# Patient Record
Sex: Female | Born: 1980 | Race: White | Hispanic: No | Marital: Single
Health system: Southern US, Community
[De-identification: ages and names within clinical notes are randomized; demographics above are authoritative.]

## PROBLEM LIST (undated history)

## (undated) ENCOUNTER — Inpatient Hospital Stay (HOSPITAL_COMMUNITY): Payer: Self-pay

## (undated) DIAGNOSIS — K469 Unspecified abdominal hernia without obstruction or gangrene: Secondary | ICD-10-CM

## (undated) DIAGNOSIS — K219 Gastro-esophageal reflux disease without esophagitis: Secondary | ICD-10-CM

## (undated) DIAGNOSIS — K429 Umbilical hernia without obstruction or gangrene: Secondary | ICD-10-CM

## (undated) DIAGNOSIS — K2970 Gastritis, unspecified, without bleeding: Secondary | ICD-10-CM

## (undated) HISTORY — DX: Unspecified abdominal hernia without obstruction or gangrene: K46.9

## (undated) HISTORY — PX: CHOLECYSTECTOMY: SHX55

---
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
## 2002-12-27 ENCOUNTER — Inpatient Hospital Stay (HOSPITAL_COMMUNITY): Admission: AD | Admit: 2002-12-27 | Discharge: 2002-12-27 | Payer: Self-pay | Admitting: *Deleted

## 2002-12-30 ENCOUNTER — Inpatient Hospital Stay (HOSPITAL_COMMUNITY): Admission: AD | Admit: 2002-12-30 | Discharge: 2002-12-30 | Payer: Self-pay | Admitting: *Deleted

## 2003-01-05 ENCOUNTER — Inpatient Hospital Stay (HOSPITAL_COMMUNITY): Admission: AD | Admit: 2003-01-05 | Discharge: 2003-01-07 | Payer: Self-pay | Admitting: Obstetrics & Gynecology

## 2003-01-06 ENCOUNTER — Encounter: Payer: Self-pay | Admitting: Obstetrics & Gynecology

## 2003-01-10 ENCOUNTER — Inpatient Hospital Stay (HOSPITAL_COMMUNITY): Admission: AD | Admit: 2003-01-10 | Discharge: 2003-01-10 | Payer: Self-pay | Admitting: *Deleted

## 2003-01-11 ENCOUNTER — Inpatient Hospital Stay (HOSPITAL_COMMUNITY): Admission: AD | Admit: 2003-01-11 | Discharge: 2003-01-11 | Payer: Self-pay | Admitting: Obstetrics and Gynecology

## 2003-01-22 ENCOUNTER — Inpatient Hospital Stay (HOSPITAL_COMMUNITY): Admission: AD | Admit: 2003-01-22 | Discharge: 2003-01-23 | Payer: Self-pay | Admitting: Obstetrics and Gynecology

## 2003-01-26 ENCOUNTER — Observation Stay (HOSPITAL_COMMUNITY): Admission: AD | Admit: 2003-01-26 | Discharge: 2003-01-27 | Payer: Self-pay | Admitting: Obstetrics & Gynecology

## 2003-01-26 ENCOUNTER — Encounter: Admission: RE | Admit: 2003-01-26 | Discharge: 2003-01-26 | Payer: Self-pay | Admitting: Family Medicine

## 2003-01-29 ENCOUNTER — Inpatient Hospital Stay (HOSPITAL_COMMUNITY): Admission: AD | Admit: 2003-01-29 | Discharge: 2003-01-29 | Payer: Self-pay | Admitting: Family Medicine

## 2003-01-31 ENCOUNTER — Other Ambulatory Visit: Admission: RE | Admit: 2003-01-31 | Discharge: 2003-01-31 | Payer: Self-pay | Admitting: Obstetrics and Gynecology

## 2003-01-31 ENCOUNTER — Inpatient Hospital Stay (HOSPITAL_COMMUNITY): Admission: AD | Admit: 2003-01-31 | Discharge: 2003-01-31 | Payer: Self-pay | Admitting: Obstetrics and Gynecology

## 2003-02-01 ENCOUNTER — Inpatient Hospital Stay (HOSPITAL_COMMUNITY): Admission: AD | Admit: 2003-02-01 | Discharge: 2003-02-01 | Payer: Self-pay | Admitting: Obstetrics and Gynecology

## 2003-02-02 ENCOUNTER — Other Ambulatory Visit: Admission: RE | Admit: 2003-02-02 | Discharge: 2003-02-02 | Payer: Self-pay | Admitting: Obstetrics and Gynecology

## 2003-02-03 ENCOUNTER — Inpatient Hospital Stay (HOSPITAL_COMMUNITY): Admission: AD | Admit: 2003-02-03 | Discharge: 2003-02-04 | Payer: Self-pay | Admitting: Obstetrics and Gynecology

## 2003-02-03 ENCOUNTER — Encounter: Payer: Self-pay | Admitting: Obstetrics and Gynecology

## 2003-06-04 ENCOUNTER — Inpatient Hospital Stay (HOSPITAL_COMMUNITY): Admission: AD | Admit: 2003-06-04 | Discharge: 2003-06-06 | Payer: Self-pay | Admitting: Obstetrics and Gynecology

## 2003-06-07 ENCOUNTER — Inpatient Hospital Stay (HOSPITAL_COMMUNITY): Admission: AD | Admit: 2003-06-07 | Discharge: 2003-06-07 | Payer: Self-pay | Admitting: Obstetrics and Gynecology

## 2003-06-08 ENCOUNTER — Inpatient Hospital Stay (HOSPITAL_COMMUNITY): Admission: AD | Admit: 2003-06-08 | Discharge: 2003-06-10 | Payer: Self-pay | Admitting: Obstetrics and Gynecology

## 2003-07-13 ENCOUNTER — Inpatient Hospital Stay (HOSPITAL_COMMUNITY): Admission: AD | Admit: 2003-07-13 | Discharge: 2003-07-13 | Payer: Self-pay | Admitting: Obstetrics and Gynecology

## 2003-07-18 ENCOUNTER — Inpatient Hospital Stay (HOSPITAL_COMMUNITY): Admission: AD | Admit: 2003-07-18 | Discharge: 2003-07-20 | Payer: Self-pay | Admitting: Obstetrics and Gynecology

## 2003-08-15 ENCOUNTER — Other Ambulatory Visit: Admission: RE | Admit: 2003-08-15 | Discharge: 2003-08-15 | Payer: Self-pay | Admitting: Obstetrics and Gynecology

## 2004-08-27 ENCOUNTER — Other Ambulatory Visit: Admission: RE | Admit: 2004-08-27 | Discharge: 2004-08-27 | Payer: Self-pay | Admitting: Obstetrics and Gynecology

## 2004-12-04 ENCOUNTER — Ambulatory Visit (HOSPITAL_COMMUNITY): Admission: RE | Admit: 2004-12-04 | Discharge: 2004-12-04 | Payer: Self-pay | Admitting: Obstetrics and Gynecology

## 2006-04-30 ENCOUNTER — Encounter (INDEPENDENT_AMBULATORY_CARE_PROVIDER_SITE_OTHER): Payer: Self-pay | Admitting: *Deleted

## 2006-04-30 ENCOUNTER — Ambulatory Visit (HOSPITAL_COMMUNITY): Admission: RE | Admit: 2006-04-30 | Discharge: 2006-04-30 | Payer: Self-pay | Admitting: Surgery

## 2006-08-17 ENCOUNTER — Emergency Department (HOSPITAL_COMMUNITY): Admission: EM | Admit: 2006-08-17 | Discharge: 2006-08-17 | Payer: Self-pay | Admitting: Family Medicine

## 2006-10-14 ENCOUNTER — Other Ambulatory Visit: Admission: RE | Admit: 2006-10-14 | Discharge: 2006-10-14 | Payer: Self-pay | Admitting: Obstetrics and Gynecology

## 2006-12-22 ENCOUNTER — Emergency Department (HOSPITAL_COMMUNITY): Admission: EM | Admit: 2006-12-22 | Discharge: 2006-12-22 | Payer: Self-pay | Admitting: Emergency Medicine

## 2006-12-22 ENCOUNTER — Ambulatory Visit: Payer: Self-pay | Admitting: Vascular Surgery

## 2007-01-12 ENCOUNTER — Ambulatory Visit (HOSPITAL_COMMUNITY): Admission: RE | Admit: 2007-01-12 | Discharge: 2007-01-12 | Payer: Self-pay | Admitting: Obstetrics and Gynecology

## 2007-05-21 ENCOUNTER — Inpatient Hospital Stay (HOSPITAL_COMMUNITY): Admission: RE | Admit: 2007-05-21 | Discharge: 2007-05-23 | Payer: Self-pay | Admitting: Obstetrics and Gynecology

## 2007-05-26 ENCOUNTER — Emergency Department (HOSPITAL_COMMUNITY): Admission: EM | Admit: 2007-05-26 | Discharge: 2007-05-26 | Payer: Self-pay | Admitting: Emergency Medicine

## 2007-06-25 ENCOUNTER — Other Ambulatory Visit: Admission: RE | Admit: 2007-06-25 | Discharge: 2007-06-25 | Payer: Self-pay | Admitting: Obstetrics and Gynecology

## 2007-12-01 ENCOUNTER — Emergency Department (HOSPITAL_COMMUNITY): Admission: EM | Admit: 2007-12-01 | Discharge: 2007-12-01 | Payer: Self-pay | Admitting: Family Medicine

## 2008-01-03 ENCOUNTER — Other Ambulatory Visit: Payer: Self-pay | Admitting: Emergency Medicine

## 2008-01-03 ENCOUNTER — Emergency Department (HOSPITAL_COMMUNITY): Admission: EM | Admit: 2008-01-03 | Discharge: 2008-01-03 | Payer: Self-pay | Admitting: Emergency Medicine

## 2008-04-19 ENCOUNTER — Emergency Department (HOSPITAL_COMMUNITY): Admission: EM | Admit: 2008-04-19 | Discharge: 2008-04-20 | Payer: Self-pay | Admitting: Emergency Medicine

## 2008-11-08 ENCOUNTER — Emergency Department (HOSPITAL_COMMUNITY): Admission: EM | Admit: 2008-11-08 | Discharge: 2008-11-08 | Payer: Self-pay | Admitting: Family Medicine

## 2009-03-29 ENCOUNTER — Inpatient Hospital Stay (HOSPITAL_COMMUNITY): Admission: AD | Admit: 2009-03-29 | Discharge: 2009-03-30 | Payer: Self-pay | Admitting: Obstetrics and Gynecology

## 2009-04-20 ENCOUNTER — Inpatient Hospital Stay (HOSPITAL_COMMUNITY): Admission: AD | Admit: 2009-04-20 | Discharge: 2009-04-20 | Payer: Self-pay | Admitting: Obstetrics & Gynecology

## 2009-05-16 ENCOUNTER — Ambulatory Visit (HOSPITAL_COMMUNITY): Admission: RE | Admit: 2009-05-16 | Discharge: 2009-05-16 | Payer: Self-pay | Admitting: Diagnostic Radiology

## 2009-06-21 ENCOUNTER — Inpatient Hospital Stay (HOSPITAL_COMMUNITY): Admission: AD | Admit: 2009-06-21 | Discharge: 2009-06-22 | Payer: Self-pay | Admitting: Obstetrics

## 2009-08-02 ENCOUNTER — Ambulatory Visit (HOSPITAL_COMMUNITY): Admission: RE | Admit: 2009-08-02 | Discharge: 2009-08-02 | Payer: Self-pay | Admitting: Obstetrics

## 2009-10-13 ENCOUNTER — Inpatient Hospital Stay (HOSPITAL_COMMUNITY): Admission: AD | Admit: 2009-10-13 | Discharge: 2009-10-15 | Payer: Self-pay | Admitting: Obstetrics

## 2009-10-13 ENCOUNTER — Inpatient Hospital Stay (HOSPITAL_COMMUNITY): Admission: AD | Admit: 2009-10-13 | Discharge: 2009-10-13 | Payer: Self-pay | Admitting: Obstetrics

## 2010-07-14 ENCOUNTER — Encounter: Payer: Self-pay | Admitting: Internal Medicine

## 2010-08-14 ENCOUNTER — Ambulatory Visit: Admit: 2010-08-14 | Payer: Self-pay | Admitting: Internal Medicine

## 2010-08-14 ENCOUNTER — Ambulatory Visit: Payer: Self-pay | Admitting: Internal Medicine

## 2010-08-21 ENCOUNTER — Encounter: Payer: Self-pay | Admitting: Internal Medicine

## 2010-08-21 ENCOUNTER — Ambulatory Visit (INDEPENDENT_AMBULATORY_CARE_PROVIDER_SITE_OTHER): Payer: BC Managed Care – PPO | Admitting: Internal Medicine

## 2010-08-21 DIAGNOSIS — K219 Gastro-esophageal reflux disease without esophagitis: Secondary | ICD-10-CM | POA: Insufficient documentation

## 2010-08-21 DIAGNOSIS — Z23 Encounter for immunization: Secondary | ICD-10-CM

## 2010-08-21 DIAGNOSIS — B079 Viral wart, unspecified: Secondary | ICD-10-CM

## 2010-08-21 DIAGNOSIS — R9431 Abnormal electrocardiogram [ECG] [EKG]: Secondary | ICD-10-CM | POA: Insufficient documentation

## 2010-08-29 NOTE — Assessment & Plan Note (Signed)
Summary: NEW [employer] # CD   Vital Signs:  Patient profile:   30 year old female Menstrual status:  regular LMP:     08/17/2010 Height:      63 inches Weight:      154 pounds BMI:     27.38 O2 Sat:      97 % on Room air Temp:     98.3 degrees F oral Pulse rate:   58 / minute Pulse rhythm:   regular Resp:     16 per minute BP sitting:   106 / 70  (left arm) Cuff size:   large  Vitals Entered By: Rock Nephew CMA (August 21, 2010 9:23 AM)  Nutrition Counseling: Patient's BMI is greater than 25 and therefore counseled on weight management options.  O2 Flow:  Room air CC: New to establish Is Patient Diabetic? No Pain Assessment Patient in pain? no       Does patient need assistance? Functional Status Self care Ambulation Normal LMP (date): 08/17/2010     Menstrual Status regular Enter LMP: 08/17/2010 Last PAP Result normal   Primary Care Provider:  Etta Grandchild MD  CC:  New to establish.  History of Present Illness: New to me she asks to be referred to a Cardiologist. She has had several episodes of chest pain over the last 3 years and was told in an ER that she had a heart murmur. The murmur is not always heard but she knows that she can bring it out if she takes in a lot of caffeine and chocolate.  Also, she has warts around her left knee and she wants to treat them.  Preventive Screening-Counseling & Management  Alcohol-Tobacco     Smoking Status: never  Caffeine-Diet-Exercise     Does Patient Exercise: no      Drug Use:  no.    Current Medications (verified): 1)  Omeprazole 20 Mg Tbec (Omeprazole) .... Take 1 Tablet By Mouth Once A Day  Allergies (verified): No Known Drug Allergies  Past History:  Past Medical History: GERD  Past Surgical History: Cholecystectomy  Family History: Family History Depression  Social History: Occupation: Radio broadcast assistant Married Never Smoked Alcohol use-no Drug use-no Regular exercise-no Smoking Status:   never Drug Use:  no Does Patient Exercise:  no  Review of Systems       The patient complains of weight gain and chest pain.  The patient denies anorexia, fever, weight loss, syncope, dyspnea on exertion, peripheral edema, prolonged cough, headaches, hemoptysis, abdominal pain, melena, hematochezia, severe indigestion/heartburn, hematuria, transient blindness, difficulty walking, depression, enlarged lymph nodes, and angioedema.   CV:  Complains of chest pain or discomfort and weight gain; denies difficulty breathing at night, difficulty breathing while lying down, fainting, fatigue, leg cramps with exertion, lightheadness, near fainting, palpitations, shortness of breath with exertion, and swelling of feet. Derm:  Complains of lesion(s); denies changes in color of skin, changes in nail beds, dryness, excessive perspiration, flushing, hair loss, insect bite(s), itching, poor wound healing, and rash.  Physical Exam  General:  alert, well-developed, well-nourished, well-hydrated, appropriate dress, normal appearance, healthy-appearing, cooperative to examination, and good hygiene.   Head:  normocephalic, atraumatic, no abnormalities observed, and no abnormalities palpated.   Eyes:  vision grossly intact and pupils equal.   Ears:  R ear normal and L ear normal.   Nose:  External nasal examination shows no deformity or inflammation. Nasal mucosa are pink and moist without lesions or exudates. Mouth:  Oral  mucosa and oropharynx without lesions or exudates.  Teeth in good repair. Neck:  supple, full ROM, no masses, no thyromegaly, no thyroid nodules or tenderness, no JVD, normal carotid upstroke, no carotid bruits, no cervical lymphadenopathy, and no neck tenderness.   Chest Wall:  No deformities, masses, or tenderness noted. Lungs:  normal respiratory effort, no intercostal retractions, no accessory muscle use, normal breath sounds, no dullness, no fremitus, no crackles, and no wheezes.   Heart:   normal rate, regular rhythm, no murmur, no gallop, no rub, and no JVD.   Abdomen:  soft, non-tender, normal bowel sounds, no distention, no masses, no guarding, no rigidity, no rebound tenderness, no abdominal hernia, no inguinal hernia, no hepatomegaly, and no splenomegaly.   Msk:  No deformity or scoliosis noted of thoracic or lumbar spine.   Pulses:  R and L carotid,radial,femoral,dorsalis pedis and posterior tibial pulses are full and equal bilaterally Extremities:  No clubbing, cyanosis, edema, or deformity noted with normal full range of motion of all joints.   Neurologic:  No cranial nerve deficits noted. Station and gait are normal. Plantar reflexes are down-going bilaterally. DTRs are symmetrical throughout. Sensory, motor and coordinative functions appear intact. Skin:  turgor normal, color normal, no rashes, no suspicious lesions, no ecchymoses, no petechiae, no purpura, no ulcerations, no edema, and tattoo(s).  Around her left knee there are scatterred verrucous lesions that are flat and slightly tanned. There is one prominent lesion that is liner/horizontal. Cervical Nodes:  no anterior cervical adenopathy and no posterior cervical adenopathy.   Axillary Nodes:  no R axillary adenopathy and no L axillary adenopathy.   Psych:  Cognition and judgment appear intact. Alert and cooperative with normal attention span and concentration. No apparent delusions, illusions, hallucinations Additional Exam:  Her EKG shows diffusely flat and inverted T waves and LAE. There are no Q waves. The rate is bradycardic.   Impression & Recommendations:  Problem # 1:  WART, VIRAL (ICD-078.10) Assessment New start Aldara Cream as directed  Problem # 2:  ABNORMAL ELECTROCARDIOGRAM (ICD-794.31) Assessment: New  Orders: EKG w/ Interpretation (93000) Cardiology Referral (Cardiology)  Complete Medication List: 1)  Omeprazole 20 Mg Tbec (Omeprazole) .... Take 1 tablet by mouth once a day 2)  Imiquimod 5 %  Crea (Imiquimod) .... Apply a small amount to warts every monday wednesday and friday for 2 weeks  Other Orders: Tdap => 10yrs IM (04540) Admin 1st Vaccine (98119)  PAP Screening:    Last PAP smear:  07/14/2010  Osteoporosis Risk Assessment:  Risk Factors for Fracture or Low Bone Density:   Race (White or Asian):     yes   Smoking status:       never  Immunization & Chemoprophylaxis:    Tetanus vaccine: Tdap  (08/21/2010)  Patient Instructions: 1)  Please schedule a follow-up appointment in 2 months. 2)  It is important that you exercise regularly at least 20 minutes 5 times a week. If you develop chest pain, have severe difficulty breathing, or feel very tired , stop exercising immediately and seek medical attention. 3)  You need to lose weight. Consider a lower calorie diet and regular exercise.  Prescriptions: IMIQUIMOD 5 % CREA (IMIQUIMOD) Apply a small amount to warts every Monday Wednesday and Friday for 2 weeks  #12 packs x 1   Entered and Authorized by:   Etta Grandchild MD   Signed by:   Etta Grandchild MD on 08/21/2010   Method used:   Print then  Mail to Patient   RxID:   [id number]    Orders Added: 1)  EKG w/ Interpretation [93000] 2)  Cardiology Referral [Cardiology] 3)  Tdap => 58yrs IM [90715] 4)  Admin 1st Vaccine [90471] 5)  New Patient Level IV [14782]   Immunizations Administered:  Tetanus Vaccine:    Vaccine Type: Tdap    Site: left deltoid    Mfr: GlaxoSmithKline    Dose: 0.5 ml    Route: IM    Given by: Rock Nephew CMA    Exp. Date: 05/02/2012    Lot #: [id number]    VIS given: 05/31/08 version given August 21, 2010.   Immunizations Administered:  Tetanus Vaccine:    Vaccine Type: Tdap    Site: left deltoid    Mfr: GlaxoSmithKline    Dose: 0.5 ml    Route: IM    Given by: Rock Nephew CMA    Exp. Date: 05/02/2012    Lot #: [id number]    VIS given: 05/31/08 version given August 21, 2010.  Preventive Care Screening  Pap  Smear:    Date:  07/14/2010    Results:  normal

## 2010-09-10 DIAGNOSIS — J329 Chronic sinusitis, unspecified: Secondary | ICD-10-CM | POA: Insufficient documentation

## 2010-09-10 DIAGNOSIS — R1115 Cyclical vomiting syndrome unrelated to migraine: Secondary | ICD-10-CM | POA: Insufficient documentation

## 2010-09-10 DIAGNOSIS — Z8679 Personal history of other diseases of the circulatory system: Secondary | ICD-10-CM | POA: Insufficient documentation

## 2010-09-10 DIAGNOSIS — F319 Bipolar disorder, unspecified: Secondary | ICD-10-CM | POA: Insufficient documentation

## 2010-09-11 ENCOUNTER — Ambulatory Visit (INDEPENDENT_AMBULATORY_CARE_PROVIDER_SITE_OTHER): Payer: BC Managed Care – PPO | Admitting: Cardiovascular Disease

## 2010-09-11 ENCOUNTER — Encounter: Payer: Self-pay | Admitting: Cardiovascular Disease

## 2010-09-11 DIAGNOSIS — R079 Chest pain, unspecified: Secondary | ICD-10-CM | POA: Insufficient documentation

## 2010-09-11 DIAGNOSIS — R011 Cardiac murmur, unspecified: Secondary | ICD-10-CM

## 2010-09-11 DIAGNOSIS — R072 Precordial pain: Secondary | ICD-10-CM

## 2010-09-19 NOTE — Assessment & Plan Note (Signed)
Summary: np6, abn ekg.per mary from [facility]. notes in emr. bcbs. gd   Primary Provider:  Etta Grandchild MD  CC:  referal form Dr. Yetta Barre pt states she was having chest tightness.Marland Kitchenand sob.  History of Present Illness: 41 you history of murmur and SSCP. Mulltiple ER visits for atypical pain.  R/O.  Did not get ETT in past as she had no insurance.  Pain is sharp.  Occurs when she is stressed.  3 children and both she and her husband try to work.  Overweight but no HTN, lipids,DM or smoking.  Also been told she has a murmur in past.  Primary indicated abnormal ECG. 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  Given ER visits and long standing history reasonable to do stress echo.  Symptoms appear to be related to anxiety and stress  Current Problems (verified): 1)  Heart Murmur, Hx of  (ICD-V12.50) 2)  Bipolar Disorder Unspecified  (ICD-296.80) 3)  Sinusitis  (ICD-473.9) 4)  Hyperemesis  (ICD-536.2) 5)  Family History Depression  (ICD-V17.0) 6)  Gerd  (ICD-530.81) 7)  Wart, Viral  (ICD-078.10) 8)  Abnormal Electrocardiogram  (ICD-794.31)  Current Medications (verified): 1)  Omeprazole 20 Mg Tbec (Omeprazole) .... Take 1 Tablet By Mouth Once A Day 2)  Imiquimod 5 % Crea (Imiquimod) .... Apply A Small Amount To Warts Every Monday Wednesday and Friday For 2 Weeks  Allergies (verified): No Known Drug Allergies  Past History:  Past Medical History: Last updated: 09/10/2010  HEART MURMUR, HX OF BIPOLAR DISORDER UNSPECIFIED SINUSITIS HYPEREMESIS  FAMILY HISTORY DEPRESSION  GERD WART, VIRAL  ABNORMAL ELECTROCARDIOGRAM  Past Surgical History: Last updated: 08/21/2010 Cholecystectomy  Family History: Last updated: 09/10/2010 Family History Depression cancer, diabetes   Social History: Last updated: 08/21/2010 Occupation: nail tech Married Never Smoked Alcohol use-no Drug use-no Regular exercise-no  Review of Systems       Denies fever, malais, weight loss, blurry vision,  decreased visual acuity, cough, sputum, SOB, hemoptysis, pleuritic pain, palpitaitons, heartburn, abdominal pain, melena, lower extremity edema, claudication, or rash.   Vital Signs:  Patient profile:   30 year old female Menstrual status:  regular Height:      63 inches Weight:      155 pounds BMI:     27.56 Pulse rate:   60 / minute Resp:     16 per minute BP sitting:   115 / 80  (left arm)  Vitals Entered By: Kem Parkinson (September 11, 2010 12:00 PM)  Physical Exam  General:  Affect appropriate Healthy:  appears stated age HEENT: normal Neck supple with no adenopathy JVP normal no bruits no thyromegaly Lungs clear with no wheezing and good diaphragmatic motion Heart:  S1/S2 splict no murmur,rub, gallop or click PMI normal Abdomen: benighn, BS positve, no tenderness, no AAA no bruit.  No HSM or HJR Distal pulses intact with no bruits No edema Neuro non-focal Skin warm and dry    Impression & Recommendations:  Problem # 1:  HEART MURMUR, HX OF (ICD-V12.50) No murmur on exam.  Split secodn heart sound.  Echo   Problem # 2:  ABNORMAL ELECTROCARDIOGRAM (ICD-794.31) Normal variant ICRBBB Orders: Stress Echo (Stress Echo)  Problem # 3:  CHEST PAIN UNSPECIFIED (ICD-786.50) Atypical  Stress echo ordered  Patient Instructions: 1)  Your physician recommends that you schedule a follow-up appointment as needed with Dr. Eden Emms. 2)  Your physician recommends that you continue on your current medications as directed. Please refer to the Current Medication  list given to you today. 3)  Your physician has requested that you have a stress echocardiogram. For further information please visit [email or web address]  Please follow instruction sheet as given.

## 2010-09-24 ENCOUNTER — Encounter: Payer: BC Managed Care – PPO | Admitting: Physician Assistant

## 2010-09-24 ENCOUNTER — Other Ambulatory Visit (HOSPITAL_COMMUNITY): Payer: BC Managed Care – PPO

## 2010-09-25 ENCOUNTER — Encounter (INDEPENDENT_AMBULATORY_CARE_PROVIDER_SITE_OTHER): Payer: Self-pay | Admitting: *Deleted

## 2010-10-01 ENCOUNTER — Other Ambulatory Visit (HOSPITAL_COMMUNITY): Payer: Self-pay | Admitting: Cardiovascular Disease

## 2010-10-01 DIAGNOSIS — R9431 Abnormal electrocardiogram [ECG] [EKG]: Secondary | ICD-10-CM

## 2010-10-01 NOTE — Letter (Signed)
Summary: Appointment - Reschedule  [facility], Main Office  [address]   [place], [place] [address]   Phone: [phone]  Fax: [phone]     September 25, 2010 MRN: [id number]   [name] [address] [place], [place]  [address]   Dear [name],   Due to a change in our office schedule, your appointment on  10-02-10  at 1:00pm               must be changed, it was rescheduled from 3-13 and the treadmill was not put in correctly and there is not enough time to do it on the 21st.  It is very important that we reach you to reschedule this appointment. We look forward to participating in your health care needs. Please contact us at the number listed above at your earliest convenience to reschedule this appointment.     Sincerely,  Glass blower/designer

## 2010-10-02 ENCOUNTER — Other Ambulatory Visit (HOSPITAL_COMMUNITY): Payer: BC Managed Care – PPO | Admitting: Radiology

## 2010-10-02 ENCOUNTER — Other Ambulatory Visit (HOSPITAL_COMMUNITY): Payer: BC Managed Care – PPO

## 2010-10-02 LAB — CBC
HCT: 33.3 % — ABNORMAL LOW (ref 36.0–46.0)
Hemoglobin: 11.6 g/dL — ABNORMAL LOW (ref 12.0–15.0)
Platelets: 214 10*3/uL (ref 150–400)
RBC: 3.8 MIL/uL — ABNORMAL LOW (ref 3.87–5.11)
RBC: 4.45 MIL/uL (ref 3.87–5.11)
WBC: 13.4 10*3/uL — ABNORMAL HIGH (ref 4.0–10.5)
WBC: 14.8 10*3/uL — ABNORMAL HIGH (ref 4.0–10.5)

## 2010-10-02 LAB — RPR: RPR Ser Ql: NONREACTIVE

## 2010-10-15 LAB — CBC
MCHC: 34.5 g/dL (ref 30.0–36.0)
RBC: 4.71 MIL/uL (ref 3.87–5.11)

## 2010-10-15 LAB — COMPREHENSIVE METABOLIC PANEL
ALT: 14 U/L (ref 0–35)
AST: 18 U/L (ref 0–37)
Albumin: 3.7 g/dL (ref 3.5–5.2)
CO2: 25 mEq/L (ref 19–32)
Calcium: 9.1 mg/dL (ref 8.4–10.5)
GFR calc Af Amer: >60 mL/min (ref >60)
GFR calc non Af Amer: >60 mL/min (ref >60)
Sodium: 136 mEq/L (ref 135–145)
Total Protein: 7.4 g/dL (ref 6.0–8.3)

## 2010-10-15 LAB — URINALYSIS, ROUTINE W REFLEX MICROSCOPIC
Bilirubin Urine: NEGATIVE
Hgb urine dipstick: NEGATIVE
Nitrite: NEGATIVE
Specific Gravity, Urine: >1.03 — ABNORMAL HIGH (ref 1.005–1.030)
pH: 6 (ref 5.0–8.0)

## 2010-10-15 LAB — URINE MICROSCOPIC-ADD ON

## 2010-10-17 LAB — URINALYSIS, ROUTINE W REFLEX MICROSCOPIC
Glucose, UA: NEGATIVE mg/dL
Ketones, ur: >80 mg/dL — AB
pH: 6 (ref 5.0–8.0)

## 2010-10-17 LAB — URINE MICROSCOPIC-ADD ON

## 2010-10-17 LAB — BASIC METABOLIC PANEL
BUN: 6 mg/dL (ref 6–23)
Calcium: 9 mg/dL (ref 8.4–10.5)
GFR calc non Af Amer: >60 mL/min (ref >60)
Glucose, Bld: 72 mg/dL (ref 70–99)

## 2010-10-17 LAB — URINE CULTURE: Colony Count: 3000

## 2010-10-18 LAB — URINALYSIS, ROUTINE W REFLEX MICROSCOPIC
Bilirubin Urine: NEGATIVE
Hgb urine dipstick: NEGATIVE
Ketones, ur: 15 mg/dL — AB
Nitrite: NEGATIVE
Urobilinogen, UA: 0.2 mg/dL (ref 0.0–1.0)

## 2010-10-18 LAB — COMPREHENSIVE METABOLIC PANEL
Alkaline Phosphatase: 39 U/L (ref 39–117)
BUN: 7 mg/dL (ref 6–23)
CO2: 26 mEq/L (ref 19–32)
GFR calc non Af Amer: >60 mL/min (ref >60)
Glucose, Bld: 91 mg/dL (ref 70–99)
Potassium: 3 mEq/L — ABNORMAL LOW (ref 3.5–5.1)
Total Protein: 5.7 g/dL — ABNORMAL LOW (ref 6.0–8.3)

## 2010-11-26 NOTE — Discharge Summary (Signed)
[name], [name]               ACCOUNT NO.:  [id number]   MEDICAL RECORD NO.:  [id number]          PATIENT TYPE:  INP   LOCATION:  9123                          [facility]   PHYSICIAN:  James A. Ashley Royalty, M.D.DATE OF BIRTH:  04-Aug-1980   DATE OF ADMISSION:  05/21/2007  DATE OF DISCHARGE:  05/23/2007                               DISCHARGE SUMMARY   DISCHARGE DIAGNOSES:  1. Intrauterine pregnancy at [redacted] weeks gestation, delivered.  2. Bipolar disorder.  3. Term birth living child, vertex.   OPERATIONS AND PROCEDURES:  OB delivery with repair of second-degree  midline laceration.   CONSULTATIONS:  None.   DISCHARGE MEDICATIONS:  Percocet, Motrin.   HISTORY AND PHYSICAL:  This is a 24-year gravida 3, para 1-0-1-1 at [redacted]  weeks gestation.  Prenatal care was complicated by high-grade emesis as  well as positive chlamydia culture which was treated.  The patient was  admitted for induction secondary to musculoskeletal discomfort.  Initial  cervical evaluation by me revealed a dilatation of 4 cm, 80% effacement,  -2 station, vertex presentation.  For remainder of History and Physical,  please see chart.   HOSPITAL COURSE:  The patient was admitted to [facility].  Admission laboratory studies were drawn.  She went on to  deliver on May 21, 2007.  The infant was 8 pounds 4 ounces female  Apgars 9 at 1 minute and 9 at 5 minutes, sent to the newborn nursery.  Delivery was accomplished over an intact perineum.  There was a second-  degree midline laceration which was repaired without difficulty.  The  patient's postpartum course was benign.  She was discharged on the  second postpartum day, afebrile and in satisfactory condition.   DISPOSITION:  The patient is to return to [facility] approximately June 25, 2007, for postpartum evaluation.      James A. Ashley Royalty, M.D.  Electronically Signed     JAM/MEDQ  D:  06/24/2007  T:   06/24/2007  Job:  [id number]

## 2010-11-29 NOTE — H&P (Signed)
NAME:  [name], [name]                         ACCOUNT NO.:  [id number]   MEDICAL RECORD NO.:  [id number]                   PATIENT TYPE:  OBV   LOCATION:  9399                                 [facility]   PHYSICIAN:  James A. Ashley Royalty, M.D.             DATE OF BIRTH:  September 08, 1980   DATE OF ADMISSION:  06/08/2003  DATE OF DISCHARGE:                                HISTORY & PHYSICAL   HISTORY OF PRESENT ILLNESS:  This is a 30 year old gravida 2 para 0 AB 1;  EDC is July 27, 2003; 32 weeks six days gestation.  Prenatal care has  been complicated by significant hyperemesis gravidarum which early on in the  pregnancy required hospitalization and ultimately outpatient Reglan pump.  The patient subsequently became asymptomatic and the pump was discontinued.  She continued to gain weight nicely through the second trimester.  Recently  she has experienced recurrent exacerbations of nausea and vomiting which  have required frequent trips to maternity admission and a recent short  hospitalization as well.  The patient returns today complaining of nausea,  vomiting, and inability to maintain p.o. intake on an outpatient basis.  She  has had some intermittent contractions but no genuine diagnosis of preterm  labor has been made.  She denies rupture of membranes or bleeding.   MEDICATIONS:  Currently on:  1. Reglan 10 mg t.i.d. p.r.n. nausea.  2. Prenatal vitamins.   PAST MEDICAL HISTORY:  Bipolar disorder.   SURGICAL HISTORY:  Negative.   FAMILY HISTORY:  Noncontributory.   SOCIAL HISTORY:  The patient denies the use of tobacco or significant  alcohol.   REVIEW OF SYSTEMS:  Noncontributory.   PHYSICAL EXAMINATION:  GENERAL:  A well-developed, well-nourished,  diminutive white female in no acute distress.  VITAL SIGNS:  Afebrile; vital signs stable.  SKIN:  Warm and dry without lesions.  LYMPH:  There is no supraclavicular, cervical, or inguinal adenopathy.  HEENT:   Normocephalic.  NECK:  Supple without thyromegaly.  CHEST:  Lungs are clear.  CARDIAC:  Regular rate and rhythm without murmurs, gallops, or rubs.  BREAST:  Deferred.  ABDOMEN:  Gravid with a fundal height of approximately 33.  Fetal heart  tones are auscultated with the Doppler.  MUSCULOSKELETAL:  Reveals full range of motion with edema, cyanosis, or CVA  tenderness.  PELVIC:  External genitalia within normal limits.  Digital examination  reveals the cervix to be closed, soft, apparent vertex, and presenting part  high.   LABORATORY DATA:  Lab today reveals a urinalysis with a specific gravity of  greater than 1.030, greater than 80 ketones, few bacteria, and few  epithelial cells.   IMPRESSION:  1. Intrauterine pregnancy at approximately [redacted] weeks gestation.  2. Bipolar disorder.  3. Hyperemesis gravidarum.   PLAN:  As the patient had numerous laboratory studies drawn in the last  several days will not repeat additional studies.  Will place  on 23-hour  observation, give IV fluids and antiemetic medication.  As this is  Thanksgiving Day, will consider pursuing outpatient home health visits when  home health agencies are open after the holiday weekend.                                               James A. Ashley Royalty, M.D.    JAM/MEDQ  D:  06/08/2003  T:  06/08/2003  Job:  [id number]

## 2010-11-29 NOTE — Discharge Summary (Signed)
NAME:  PRABHNOOR, ELLENBERGER                         ACCOUNT NO.:  [id number]   MEDICAL RECORD NO.:  [id number]                   PATIENT TYPE:  INP   LOCATION:  9307                                 [facility]   PHYSICIAN:  Rudy Jew. Ashley Royalty, M.D.             DATE OF BIRTH:  October 15, 1980   DATE OF ADMISSION:  02/03/2003  DATE OF DISCHARGE:  02/04/2003                                 DISCHARGE SUMMARY   ADMITTING DIAGNOSES:  1. Intrauterine pregnancy at 15+ weeks.  2. Hyperemesis.  3. Possible bipolar disorder.   DISCHARGE DIAGNOSES:  1. Intrauterine pregnancy at 15+ weeks.  2. Hyperemesis.  3. Possible bipolar disorders.   PROCEDURES:  1. IV hydration.  2. Initiation of a Zofran pump per home health agency.   HOSPITAL COURSE:  Mikaya Bunner was admitted on February 03, 2003 to the  maternity admissions unit where she was admitted for a 23-hour observation.  At that time it was noted that her specific gravity upon urine examination  was greater than 1.030 as well as greater than 80 ketones.  Her hemoglobin  upon admission was 13.5 with a hematocrit of 38.6.  She is a 30 year old G2  P0 AB1 with an EDC of July 26, 2003.  For past medical history please  refer the history and physical noted in the chart.  The patient received a  course of IV hydration of lactated Ringer's.  Phenergan 25 mg was added to  the bag.  Prior to discharge a Reglan pump per [facility] was  initiated but later was switched to a Zofran pump prior to discharge.  A  mental health evaluation was performed per Dr. Jeanie Sewer with [facility].  The patient was at this time determined to be bipolar  per Dr. Jeanie Sewer this examination.  She was also noted to be under  duress/stress after several trips to the emergency department were noted  within her chart.  Her history of bipolar coupled with her hyperemesis has  led to an acute onset of anxiety.  Dr. Jeanie Sewer recommended Ambien 10 mg at  h.s. to aid in her sleeping upon discharge.  The patient was discharged home  after the 23-hour observation and initiation of her Zofran pump for [facility].   DISCHARGE INSTRUCTIONS:  The patient is to remain on bedrest for the  remainder of the week until her nausea subsides.  She is also to report an  exacerbations of her bipolar disorder to [facility] at the  follow-up with them.   DIET:  The patient is encouraged to follow a bland diet, to increase her  fluids, and to advance her diet as she can tolerate it.   DISCHARGE MEDICATIONS:  1. Zofran pump per protocol for [facility].  2. Ambien 10 mg p.o. at h.s.  3. Prenatal vitamins.   DISCHARGE FOLLOW-UP:  The patient to  follow up within one to two weeks at  [facility] with Dr. Lonell Face.     Velora Mediate, NP                           Rudy Jew. Ashley Royalty, M.D.    TC/MEDQ  D:  03/08/2003  T:  03/08/2003  Job:  [id number]

## 2010-11-29 NOTE — Discharge Summary (Signed)
NAME:  [name], [name]                         ACCOUNT NO.:  [id number]   MEDICAL RECORD NO.:  [id number]                   PATIENT TYPE:  INP   LOCATION:  9115                                 [facility]   PHYSICIAN:  James A. Ashley Royalty, M.D.             DATE OF BIRTH:  [birth year]   DATE OF ADMISSION:  07/18/2003  DATE OF DISCHARGE:  07/20/2003                                 DISCHARGE SUMMARY   DISCHARGE DIAGNOSES:  1. Intrauterine pregnancy at term, delivered.  2. Term birth, low ________ vertex.  3. History of hyperemesis.  4. Late presentation for obstetrical care.   OPERATION/SPECIAL PROCEDURES:  OB delivery with episiotomy, episiorrhaphy.   CONSULTATIONS:  None.   DISCHARGE MEDICATIONS:  Tylenol.   HISTORY AND PHYSICAL:  This is a 30 year old, gravida 2, para 0, AB1, 39-1/[redacted]  weeks gestation. Prenatal care complicated by hyperemesis, treated with  numerous agents and ultimately with Zofran pump. The patient had a traumatic  weight loss in early pregnancy and was just about her pre-pregnancy weight  by the time of admission.  Examination revealed the cervix to be 3 cm  dilated, 90% effaced, -2 station, vertex presentation.   HOSPITAL COURSE:  The patient was admitted to [facility].  Admission laboratory studies were drawn. She was admitted for  induction secondary to exacerbation of hyperemesis and advanced cervical  changes. The induction was successful and she went on to deliver on July 18, 2003. The infant was a 6 pound 14 ounce female, Apgars 9 at 1 minute and 9  at 5 minutes, and sent to the newborn nursery. Delivery was accomplished by  Dr. Sylvester Harder over a second-degree midline episiotomy which was  repaired without difficulty. The patient's postpartum course was benign. She  was discharged home on the second postpartum day, afebrile, and in  satisfactory condition.   CLINICAL FINDINGS:  Hemoglobin and hematocrit on admission were 12.5  and  35.7 respectively. Repeat values obtained on July 19, 2003, were 12.3 and  34.5 respectively.   DISPOSITION:  The patient is return to [facility] in  four to six weeks for postpartum visit.                                               James A. Ashley Royalty, M.D.    JAM/MEDQ  D:  08/23/2003  T:  08/23/2003  Job:  [id number]

## 2010-11-29 NOTE — Discharge Summary (Signed)
NAME:  [name], [name]                         ACCOUNT NO.:  [id number]   MEDICAL RECORD NO.:  [id number]                   PATIENT TYPE:  INP   LOCATION:  9325                                 FACILITY:  [facility]   PHYSICIAN:  James A. Ashley Royalty, M.D.             DATE OF BIRTH:  30-Jan-1981   DATE OF ADMISSION:  06/04/2003  DATE OF DISCHARGE:  06/06/2003                                 DISCHARGE SUMMARY   DISCHARGE DIAGNOSES:  1. Intrauterine pregnancy at 32-weeks gestation.  2. Hyperemesis.   OPERATION/SPECIAL PROCEDURES:  None.   DISCHARGE MEDICATIONS:  Zofran 8 mg q.8-12h. p.r.n.   HISTORY AND PHYSICAL:  This is a 30 year old, gravida 2, para 0, AB1, with  an EDC of July 27, 2003, at approximately 32-1/2 weeks on admission. The  patient presents complaining of recurrent nausea and vomiting. She has had a  history of same throughout the entire pregnancy off and on. She has been  numerous medications and has been maintained recently with Zofran. For the  remainder of the history and physical, please see chart.   HOSPITAL COURSE:  The patient was admitted to [facility]  [place]. Admission laboratory studies were drawn. She was given  antiemetic medications and IV fluids with excellent results. On June 06, 2003, she was felt stable for discharge and discharged home in satisfactory  condition.   DISPOSITION:  The patient is returned to the [facility] in  approximately one week for her next antenatal visit. She is to continue the  Zofran as prescribed.                                               James A. Ashley Royalty, M.D.    JAM/MEDQ  D:  08/10/2003  T:  08/11/2003  Job:  [id number]

## 2010-11-29 NOTE — H&P (Signed)
NAME:  [name], OTTER                         ACCOUNT NO.:  [id number]   MEDICAL RECORD NO.:  [id number]                   PATIENT TYPE:  INP   LOCATION:  9307                                 [facility]ILITY:  [facility]   PHYSICIAN:  Rudy Jew. Ashley Royalty, M.D.             DATE OF BIRTH:  01/08/1981   DATE OF ADMISSION:  02/03/2003  DATE OF DISCHARGE:                                HISTORY & PHYSICAL   HISTORY OF PRESENT ILLNESS:  The patient is a 30 year old gravida 2, para 0,  ab 1, last menstrual period October 18, 2002, [facility] (by menstrual dates) July 26, 2003, currently 15 weeks and 1 day by menstrual dates. The patient  initiated obstetrical care in my office yesterday. At that time she related  a history of numerous visits to the emergency room during this pregnancy for  dehydration  and nausea and vomiting. She has been on Reglan, Phenergan and  Zofran without good success. Yesterday she was placed on Phenergan  suppositories. However, she returned to the admissions unit this morning  stating that she had began throwing up yesterday afternoon and had not  gotten reasonable relief from the Phenergan suppositories. The patient  presents for admission and workup of hyperemesis.   MEDICATIONS:  Phenergan 25 mg per rectum q.4h. p.r.n. nausea.   PAST MEDICAL HISTORY:  Negative. Specifically the patient at the time of  this dictation denies any psychiatric history.   PAST SURGICAL HISTORY:  Therapeutic abortion January 2003 at approximately  [redacted] weeks gestation. The patient states that she had substantial nausea and  vomiting during that pregnancy which impacted her decision to pursue the  termination.   ALLERGIES:  None.   FAMILY HISTORY:  Positive for diabetes and psychiatric disease.   SOCIAL HISTORY:  The patient denies use of tobacco or significant alcohol.   REVIEW OF SYSTEMS:  Noncontributory.   PHYSICAL EXAMINATION:  GENERAL:  The patient is a diminutive white female,  in no acute  distress.  VITAL SIGNS:  Stable.  SKIN:  Warm and dry without lesions.  LYMPH:  No supraclavicular, cervical or inguinal adenopathy.  HEENT:  Normocephalic.  NECK:  Supple without thyromegaly.  LUNGS:  Clear.  CARDIAC:  Regular rate and rhythm without murmur, rub or gallops.  BREASTS:  Examination  is deferred.  ABDOMEN:  Soft, nontender without masses or organomegaly. Fetal heart  tones  are also obtained with a Doppler.  MUSCULOSKELETAL:  Range of motion without edema, cyanosis or CVA tenderness.  PELVIC:  February 02, 2003, revealed normal external genitalia and vagina and  cervix. The cervical os was closed. The bimanual examination revealed the  uterus to be approximately  15 weeks size.    IMPRESSION:  1. Intrauterine pregnancy at approximately 15 weeks, 1 day gestation (by     menstrual dates).  2. Nausea and vomiting, probable hyperemesis gravidarum.  3. Late  presentation for care.  PLAN:  The patient is admitted for 23 hour observation. Will obtain a case  management consult as well as behavioral medicine consult. Will check labs  and arrange obstetrical ultrasound. We are currently making attempts to  initiate subcutaneous pump therapy for this patient either  with Reglan or  Zofran.                                                James A. Ashley Royalty, M.D.    JAM/MEDQ  D:  02/03/2003  T:  02/03/2003  Job:  [id number]

## 2010-11-29 NOTE — H&P (Signed)
NAME:  [name], [name]                         ACCOUNT NO.:  [id number]   MEDICAL RECORD NO.:  [id number]                   PATIENT TYPE:  INP   LOCATION:  9325                                 [facility]ILITY:  [facility]   PHYSICIAN:  Juan H. Lily Peer, M.D.             DATE OF BIRTH:  [birth year]   DATE OF ADMISSION:  06/04/2003  DATE OF DISCHARGE:                                HISTORY & PHYSICAL   CHIEF COMPLAINT:  Nausea and vomiting.   HISTORY:  The patient is a 30 year old gravida 2, para 0, AB 1, with a due  date of July 27, 2003, currently at 32-1/2 weeks' estimated gestational  age, who [facility] complaining of nausea  and vomiting which started a couple of days ago and worsened today.  On  arrival to the [facility] she was vomiting approximately every 30  minutes.  Review of her prenatal history had indicated that she has suffered  from nausea and vomiting throughout her pregnancy, whereby she has been on  different antiemetic agents such as Phenergan, Reglan, Zofran orally and  subsequently had been on a Zofran pump, which she states was discontinued  approximately three months ago.  On arrival to [facility] she was  placed on the monitor and was found to be having some contractions every  three to five minutes apart and she was started on IV hydration consisting  of LR with multivitamin infusion with 10 mg of Reglan in the bag, and she  was given a 500 mL bolus, followed by 125/mL/hr.  A wet prep had  demonstrated many clue cells and her urinalysis had a specific gravity of  1.020 and negative for nitrite and small amount of leukocyte esterase, and  her electrolytes were normal, as was her CBC.   PAST MEDICAL HISTORY:  Patient with a history of bipolar disorder, has not  been on any medication.  Multiple bouts of nausea and vomiting throughout  her pregnancy.  See history of present illness above.  Also, she has been  treated for  bacterial vaginosis at 15 weeks' gestation as well.   REVIEW OF SYSTEMS:  See [place] form.   PHYSICAL EXAMINATION:  VITAL SIGNS:  Blood pressure 127/84 and 135/77, pulse  70, respirations 18, temperature 98.0.  HEENT:  Unremarkable.  NECK:  Supple, trachea midline, no carotid bruits, no thyromegaly.  CHEST:  Lungs were clear to auscultation, no rhonchi or wheezes.  CARDIAC:  Regular rate and rhythm, no murmurs or gallops.  BREASTS:  Exam not done.  ABDOMEN:  Soft, nontender.  PELVIC:  Pelvic exam by nurse:  Cervix 1 cm, long, posterior.  EXTREMITIES:  No edema.   PRENATAL LABORATORY DATA:  Blood type AB positive, negative antibody screen.  VDRL was nonreactive.  Rubella immune.  Hepatitis B surface antigen was  negative.  HIV not tested.  GC and Chlamydia culture negative.  No other  studies in the chart.  Wet prep in the emergency room demonstrated many clue  cells.  Electrolytes were normal.  CBC was normal.  Urine:  Negative  nitrite, leukocyte esterase small, specific gravity 1.020.   ASSESSMENT:  A 30 year old gravida 2, para 0, AB 1, at 32-1/2 weeks'  estimated gestational age with longstanding history of hyperemesis  gravidarum with various forms of antiemetic regimens administered before.  The Zofran pump was the only one that was able to work well for her, which  was discontinued approximately three months ago and she had been doing well  until just a couple of days ago.  Aside from the laboratory information  obtained above, will go ahead and do a complete metabolic panel and also  check a TSH.  Since she has an IV going, to take care of her bacterial  vaginosis will hang up Flagyl 500 mg to be administered IV q.12h.  She did  receive terbutaline 0.25 mg subcu and along with an IV fluid bolus,  contractions have subsided.  She has a very reactive fetal heart rate  tracing.  A GBS culture was obtained, pending at time of this dictation.  She will be admitted due to the  fact that she was very agitated, wanted to  finish this pregnancy, and I was reassuring her that it is not the right  thing to do at this time since she is 32 weeks.  She was having shakes and  very anxious.  I do not know if perhaps this is part of her bipolar  disorder, but to calm her down and to relax her we gave her Stadol 2 mg IV  with 25 mg of Phenergan, which quieted her down and relaxed her.  We will go  ahead and place a Foley catheter so she will not have to be getting up out  of bed during the night, and in the morning we will make arrangements with  Dr. Ashley Royalty, her primary physician, to perhaps reinstituted once again the  Zofran pump.  All this was discussed with the patient and her boyfriend,  which was the only supporting individual present.   PLAN:  As per assessment above.                                               Juan H. Lily Peer, M.D.    JHF/MEDQ  D:  06/05/2003  T:  06/05/2003  Job:  [id number]   cc:   Fayrene Fearing A. Ashley Royalty, M.D.  [address]  [place], [place] [address]  Fax: [phone]

## 2010-11-29 NOTE — Op Note (Signed)
[name], [name]               ACCOUNT NO.:  [id number]   MEDICAL RECORD NO.:  [id number]          PATIENT TYPE:  AMB   LOCATION:  DAY                          FACILITY:  [facility]   PHYSICIAN:  Ardeth Sportsman, MD     DATE OF BIRTH:  26-Mar-1981   DATE OF PROCEDURE:  04/30/2006  DATE OF DISCHARGE:                                 OPERATIVE REPORT   SURGEON:  Ardeth Sportsman, M.D.   ASSISTANT:  Rose Phi. Maple Hudson, M.D.   REQUESTING PHYSICIAN:  Carola Frost, M.D.   PREOPERATIVE DIAGNOSIS:  Symptomatic cholecystolithiasis.   POSTOPERATIVE DIAGNOSIS:  Symptomatic cholecystolithiasis.   PROCEDURE PERFORMED:  Laparoscopic cholecystectomy.   ANESTHESIA:  1. General.  2. Local anesthetic in a field block around all port sites.   SPECIMENS:  Gallbladder.   DRAINS:  None.   ESTIMATED BLOOD LOSS:  Less than 5 mL.   COMPLICATIONS:  None apparent.   INDICATIONS FOR PROCEDURE:  Ms. Wolff is a 30 year old female who has had  intermittent postprandial nausea and vomiting that has persisted two years  after having delivery of her child.  She has known gallstones and had a  classic episodes of biliary colic.  The anatomy and physiology of  hepatobiliary and pancreatic function was discussed.  The technique of  laparoscopic cholecystectomy was explained and recommended.  The risks of  stroke, heart attack, pulmonary embolism, and death were discussed.  Bleeding, need for transfusion, wound infection, abscess, injury to other  organs, bile duct injury, and other risks were discussed in detail.  She  understood these risks and wished to proceed.   OPERATIVE FINDINGS:  She had a dilated but normal appearing gallbladder with  cholesterol based stones.   DESCRIPTION OF PROCEDURE:  Informed consent was obtained.  The patient  underwent general anesthesia without difficulty and she was positioned  supine with both arms tucked.  Her abdomen was prepped and draped in a  sterile fashion.  Local  anesthetic was placed as a field block using 0.25%  Bupivacaine without epinephrine as noted above.  The first entry was done  using a Veress technique through a periumbilical 5 mm incision.  A towel  clamp was used to elevate the fascia of counter traction and the Veress  passed in the abdomen without difficulty.  Capnoperitoneum to 15 mmHg to  provide good abdominal insufflation.  Under direct visualization, a 10 mm  port was placed in the subxiphoid region and 5 mm ports were placed in the  right upper quadrant region.   The gallbladder fundus was grasped and elevated cephalad.  Inspection  revealed normal appearing liver and small bowel with no obvious adhesions.  The peritoneum was freed off the anteromedial and posterior aspects of the  gallbladder and careful controlled dissection was done to identify two  structures leaving the gallbladder going down to the porta hepatis,  consistent with the cystic artery and cystic duct.  One clip on the  gallbladder side and then two clips slightly proximally were placed and both  these structures were transected staying close to the gallbladder.  Cautery  was used to free the gallbladder from its remaining attachments to the  liver.  The gallbladder was removed out the subxiphoid port intact and sent  to pathology.   Visual inspection noted clips were intact on the cystic artery and cystic  duct structures.  No evidence of any bleeding.  There was no bleeding in the  gallbladder bed.  Copious irrigation of saline was done.  The subxiphoid  port was closed using 0 Vicryl using a laparoscopic suture passer.  Capnoperitoneum was evacuated.  The skin was closed using 4-0 Monocryl  stitch.  A sterile dressing was applied.  The patient was extubated and  taken to the recovery room in stable condition.   I explained the operative findings to the patient's family.      Ardeth Sportsman, MD  Electronically Signed     SCG/MEDQ  D:  04/30/2006   T:  05/01/2006  Job:  [id number]   cc:   Carola Frost, M.D.

## 2011-04-10 LAB — POCT CARDIAC MARKERS
Myoglobin, poc: 34.8
Operator id: 244461
Operator id: 264031
Troponin i, poc: <0.05

## 2011-04-22 LAB — CBC
HCT: 35.2 — ABNORMAL LOW
Hemoglobin: 11.1 — ABNORMAL LOW
Hemoglobin: 12.5
MCV: 86.7
Platelets: 213
RBC: 3.68 — ABNORMAL LOW
RBC: 4.07
WBC: 10.9 — ABNORMAL HIGH
WBC: 8.4

## 2011-04-22 LAB — ABO/RH: ABO/RH(D): AB POS

## 2011-04-22 LAB — TYPE AND SCREEN: Antibody Screen: NEGATIVE

## 2011-05-01 LAB — COMPREHENSIVE METABOLIC PANEL
ALT: 12
AST: 14
CO2: 25
Calcium: 8.9
GFR calc Af Amer: >60
GFR calc non Af Amer: >60
Potassium: 3.7
Sodium: 137
Total Protein: 5.6 — ABNORMAL LOW

## 2011-05-01 LAB — URINE CULTURE: Culture: NO GROWTH

## 2011-05-01 LAB — DIFFERENTIAL
Eosinophils Absolute: 0.1
Eosinophils Relative: 1
Lymphs Abs: 1.7
Monocytes Relative: 5

## 2011-05-01 LAB — URINALYSIS, ROUTINE W REFLEX MICROSCOPIC
Glucose, UA: NEGATIVE
Hgb urine dipstick: NEGATIVE
Nitrite: NEGATIVE
Specific Gravity, Urine: 1.025
pH: 6

## 2011-05-01 LAB — CBC
MCHC: 34.7
RBC: 4.02

## 2011-05-01 LAB — URINE MICROSCOPIC-ADD ON

## 2013-06-13 ENCOUNTER — Encounter (HOSPITAL_COMMUNITY): Payer: Self-pay | Admitting: Emergency Medicine

## 2013-06-13 ENCOUNTER — Emergency Department (HOSPITAL_COMMUNITY)
Admission: EM | Admit: 2013-06-13 | Discharge: 2013-06-13 | Disposition: A | Discharge status: Home / Self Care | Payer: Self-pay | Attending: Emergency Medicine | Admitting: Emergency Medicine

## 2013-06-13 DIAGNOSIS — K2970 Gastritis, unspecified, without bleeding: Secondary | ICD-10-CM

## 2013-06-13 DIAGNOSIS — I4510 Unspecified right bundle-branch block: Secondary | ICD-10-CM | POA: Insufficient documentation

## 2013-06-13 DIAGNOSIS — K2900 Acute gastritis without bleeding: Secondary | ICD-10-CM | POA: Insufficient documentation

## 2013-06-13 DIAGNOSIS — Z79899 Other long term (current) drug therapy: Secondary | ICD-10-CM | POA: Insufficient documentation

## 2013-06-13 DIAGNOSIS — Z3202 Encounter for pregnancy test, result negative: Secondary | ICD-10-CM | POA: Insufficient documentation

## 2013-06-13 LAB — CBC WITH DIFFERENTIAL/PLATELET
Basophils Absolute: 0 10*3/uL (ref 0.0–0.1)
Basophils Relative: 0 % (ref 0–1)
Eosinophils Absolute: 0.1 10*3/uL (ref 0.0–0.7)
Eosinophils Relative: 1 % (ref 0–5)
Hemoglobin: 14.8 g/dL (ref 12.0–15.0)
Lymphocytes Relative: 27 % (ref 12–46)
MCH: 30.9 pg (ref 26.0–34.0)
MCHC: 35.1 g/dL (ref 30.0–36.0)
Monocytes Absolute: 0.5 10*3/uL (ref 0.1–1.0)
Platelets: 241 10*3/uL (ref 150–400)
RBC: 4.79 MIL/uL (ref 3.87–5.11)
RDW: 12.5 % (ref 11.5–15.5)

## 2013-06-13 LAB — URINALYSIS, ROUTINE W REFLEX MICROSCOPIC
Glucose, UA: NEGATIVE mg/dL
Protein, ur: 30 mg/dL — AB
Specific Gravity, Urine: 1.042 — ABNORMAL HIGH (ref 1.005–1.030)
Urobilinogen, UA: 1 mg/dL (ref 0.0–1.0)

## 2013-06-13 LAB — COMPREHENSIVE METABOLIC PANEL
ALT: 10 U/L (ref 0–35)
AST: 13 U/L (ref 0–37)
CO2: 26 mEq/L (ref 19–32)
Calcium: 9.1 mg/dL (ref 8.4–10.5)
Creatinine, Ser: 0.77 mg/dL (ref 0.50–1.10)
GFR calc non Af Amer: >90 mL/min (ref >90)
Sodium: 139 mEq/L (ref 135–145)
Total Protein: 7.2 g/dL (ref 6.0–8.3)

## 2013-06-13 LAB — POCT I-STAT TROPONIN I: Troponin i, poc: 0 ng/mL (ref 0.00–0.08)

## 2013-06-13 LAB — POCT PREGNANCY, URINE: Preg Test, Ur: NEGATIVE

## 2013-06-13 MED ORDER — GI COCKTAIL ~~LOC~~
30.0000 mL | Freq: Once | ORAL | Status: AC
  Administered 2013-06-13: 30 mL via ORAL
  Filled 2013-06-13: qty 30

## 2013-06-13 MED ORDER — OMEPRAZOLE 20 MG PO CPDR: 20.0000 mg | DELAYED_RELEASE_CAPSULE | Freq: Every day | ORAL | Status: DC

## 2013-06-13 NOTE — ED Provider Notes (Signed)
CSN: [id number]     Arrival date & time 06/13/13  1254 History   First MD Initiated Contact with Patient 06/13/13 1559     Chief Complaint  Patient presents with  . Abdominal Pain   (Consider location/radiation/quality/duration/timing/severity/associated sxs/prior Treatment) HPI Comments: Patient presents with epigastric pain that started this morning. She states it's a crampy pain that's been constant since this morning. It radiates slightly to her left upper abdomen. She denies any nausea vomiting or diarrhea. She denies a fevers or chills. She is currently on her menstrual cycle. She denies any abnormal discharge. She denies any back pain. She denies any urinary symptoms. She states her last bowel movement was 2 days ago which was normal for her. She denies any bloating or constipation. She's had a history of cholecystectomy about 8 years ago. She denies any other abdominal surgeries.  Patient is a 32 y.o. female presenting with abdominal pain.  Abdominal Pain Associated symptoms: no chest pain, no chills, no cough, no diarrhea, no fatigue, no fever, no hematuria, no nausea, no shortness of breath and no vomiting     History reviewed. No pertinent past medical history. Past Surgical History  Procedure Laterality Date  . Cholecystectomy     No family history on file. History  Substance Use Topics  . Smoking status: Never Smoker   . Smokeless tobacco: Not on file  . Alcohol Use: No   OB History   Grav Para Term Preterm Abortions TAB SAB Ect Mult Living                 Review of Systems  Constitutional: Negative for fever, chills, diaphoresis and fatigue.  HENT: Negative for congestion, rhinorrhea and sneezing.   Eyes: Negative.   Respiratory: Negative for cough, chest tightness and shortness of breath.   Cardiovascular: Negative for chest pain and leg swelling.  Gastrointestinal: Positive for abdominal pain. Negative for nausea, vomiting, diarrhea and blood in stool.   Genitourinary: Negative for frequency, hematuria, flank pain and difficulty urinating.  Musculoskeletal: Negative for arthralgias and back pain.  Skin: Negative for rash.  Neurological: Negative for dizziness, speech difficulty, weakness, numbness and headaches.    Allergies  Review of patient's allergies indicates no known allergies.  Home Medications   Current Outpatient Rx  Name  Route  Sig  Dispense  Refill  . omeprazole (PRILOSEC) 20 MG capsule   Oral   Take 1 capsule (20 mg total) by mouth daily.   30 capsule   0    BP 97/53  Pulse 57  Temp(Src) 98.1 F (36.7 C) (Oral)  Resp 16  Wt 155 lb (70.308 kg)  SpO2 97%  LMP 06/11/2013 Physical Exam  Constitutional: She is oriented to person, place, and time. She appears well-developed and well-nourished.  HENT:  Head: Normocephalic and atraumatic.  Eyes: Pupils are equal, round, and reactive to light.  Neck: Normal range of motion. Neck supple.  Cardiovascular: Normal rate, regular rhythm and normal heart sounds.   Pulmonary/Chest: Effort normal and breath sounds normal. No respiratory distress. She has no wheezes. She has no rales. She exhibits no tenderness.  Abdominal: Soft. Bowel sounds are normal. There is tenderness (mild TTP epigastrium, LUQ.). There is no rebound and no guarding.  Musculoskeletal: Normal range of motion. She exhibits no edema.  Lymphadenopathy:    She has no cervical adenopathy.  Neurological: She is alert and oriented to person, place, and time.  Skin: Skin is warm and dry. No rash noted.  Psychiatric:  She has a normal mood and affect.    ED Course  Procedures (including critical care time) Labs Review Results for orders placed during the hospital encounter of 06/13/13  CBC WITH DIFFERENTIAL      Result Value Range   WBC 9.2  4.0 - 10.5 K/uL   RBC 4.79  3.87 - 5.11 MIL/uL   Hemoglobin 14.8  12.0 - 15.0 g/dL   HCT 96.0  45.4 - 09.8 %   MCV 88.1  78.0 - 100.0 fL   MCH 30.9  26.0 - 34.0 pg    MCHC 35.1  30.0 - 36.0 g/dL   RDW 11.9  14.7 - 82.9 %   Platelets 241  150 - 400 K/uL   Neutrophils Relative % 66  43 - 77 %   Neutro Abs 6.1  1.7 - 7.7 K/uL   Lymphocytes Relative 27  12 - 46 %   Lymphs Abs 2.4  0.7 - 4.0 K/uL   Monocytes Relative 6  3 - 12 %   Monocytes Absolute 0.5  0.1 - 1.0 K/uL   Eosinophils Relative 1  0 - 5 %   Eosinophils Absolute 0.1  0.0 - 0.7 K/uL   Basophils Relative 0  0 - 1 %   Basophils Absolute 0.0  0.0 - 0.1 K/uL  COMPREHENSIVE METABOLIC PANEL      Result Value Range   Sodium 139  135 - 145 mEq/L   Potassium 3.9  3.5 - 5.1 mEq/L   Chloride 103  96 - 112 mEq/L   CO2 26  19 - 32 mEq/L   Glucose, Bld 88  70 - 99 mg/dL   BUN 17  6 - 23 mg/dL   Creatinine, Ser 5.62  0.50 - 1.10 mg/dL   Calcium 9.1  8.4 - 13.0 mg/dL   Total Protein 7.2  6.0 - 8.3 g/dL   Albumin 4.3  3.5 - 5.2 g/dL   AST 13  0 - 37 U/L   ALT 10  0 - 35 U/L   Alkaline Phosphatase 52  39 - 117 U/L   Total Bilirubin 0.3  0.3 - 1.2 mg/dL   GFR calc non Af Amer >90  >90 mL/min   GFR calc Af Amer >90  >90 mL/min  LIPASE, BLOOD      Result Value Range   Lipase 34  11 - 59 U/L  URINALYSIS, ROUTINE W REFLEX MICROSCOPIC      Result Value Range   Color, Urine RED (*) YELLOW   APPearance TURBID (*) CLEAR   Specific Gravity, Urine 1.042 (*) 1.005 - 1.030   pH 6.0  5.0 - 8.0   Glucose, UA NEGATIVE  NEGATIVE mg/dL   Hgb urine dipstick LARGE (*) NEGATIVE   Bilirubin Urine SMALL (*) NEGATIVE   Ketones, ur 15 (*) NEGATIVE mg/dL   Protein, ur 30 (*) NEGATIVE mg/dL   Urobilinogen, UA 1.0  0.0 - 1.0 mg/dL   Nitrite NEGATIVE  NEGATIVE   Leukocytes, UA SMALL (*) NEGATIVE  URINE MICROSCOPIC-ADD ON      Result Value Range   Squamous Epithelial / LPF RARE  RARE   WBC, UA 0-2  <3 WBC/hpf   RBC / HPF TOO NUMEROUS TO COUNT  <3 RBC/hpf   Bacteria, UA FEW (*) RARE  POCT PREGNANCY, URINE      Result Value Range   Preg Test, Ur NEGATIVE  NEGATIVE  POCT I-STAT TROPONIN I      Result Value Range  Troponin i, poc 0.00  0.00 - 0.08 ng/mL   Comment 3            No results found.   Imaging Review No results found.  EKG Interpretation    Date/Time:  Monday June 13 2013 13:25:28 EST Ventricular Rate:  60 PR Interval:  146 QRS Duration: 96 QT Interval:  394 QTC Calculation: 394 R Axis:   88 Text Interpretation:  Normal sinus rhythm Right atrial enlargement Incomplete right bundle branch block Borderline ECG since last tracing no significant change Confirmed by Lovelle Deitrick  MD, Kayne Yuhas ([id number]) on 06/13/2013 4:46:12 PM            MDM   1. Gastritis    Patient's discomfort was relieved with a GI cocktail. She has no evidence of pancreatitis. Her abdomen is only mildly tender on exam. She has no vomiting fevers or other concerning signs. She was discharged home with a prescription for Prilosec. I advised her in a bland diet. I encouraged her to get outpatient followup if her symptoms are not improving. Advised to return here for symptoms worsen.    Rolan Bucco, MD 06/13/13 [phone]

## 2013-06-13 NOTE — ED Notes (Signed)
Pt is here with pain above navel to left side since this am.  No vomiting or diarrhea.  LMP started 2 days ago.

## 2013-06-13 NOTE — ED Notes (Signed)
Dr. Belfi back at the bedside.  

## 2013-06-13 NOTE — ED Notes (Signed)
Dr. Belfi at the bedside.  

## 2013-06-30 ENCOUNTER — Encounter (HOSPITAL_COMMUNITY): Payer: Self-pay | Admitting: *Deleted

## 2013-06-30 ENCOUNTER — Inpatient Hospital Stay (HOSPITAL_COMMUNITY)
Admission: AD | Admit: 2013-06-30 | Discharge: 2013-06-30 | Disposition: A | Discharge status: Home / Self Care | Payer: Self-pay | Source: Ambulatory Visit | Attending: Emergency Medicine | Admitting: Emergency Medicine

## 2013-06-30 ENCOUNTER — Encounter (HOSPITAL_COMMUNITY): Admission: AD | Disposition: A | Discharge status: Home / Self Care | Payer: Self-pay | Source: Ambulatory Visit

## 2013-06-30 ENCOUNTER — Inpatient Hospital Stay (HOSPITAL_COMMUNITY): Payer: Self-pay

## 2013-06-30 DIAGNOSIS — Z79899 Other long term (current) drug therapy: Secondary | ICD-10-CM | POA: Insufficient documentation

## 2013-06-30 DIAGNOSIS — K429 Umbilical hernia without obstruction or gangrene: Secondary | ICD-10-CM

## 2013-06-30 DIAGNOSIS — K56609 Unspecified intestinal obstruction, unspecified as to partial versus complete obstruction: Secondary | ICD-10-CM

## 2013-06-30 DIAGNOSIS — Z3202 Encounter for pregnancy test, result negative: Secondary | ICD-10-CM | POA: Insufficient documentation

## 2013-06-30 DIAGNOSIS — Z9089 Acquired absence of other organs: Secondary | ICD-10-CM | POA: Insufficient documentation

## 2013-06-30 DIAGNOSIS — R231 Pallor: Secondary | ICD-10-CM | POA: Insufficient documentation

## 2013-06-30 DIAGNOSIS — R112 Nausea with vomiting, unspecified: Secondary | ICD-10-CM | POA: Insufficient documentation

## 2013-06-30 HISTORY — DX: Gastritis, unspecified, without bleeding: K29.70

## 2013-06-30 HISTORY — DX: Gastro-esophageal reflux disease without esophagitis: K21.9

## 2013-06-30 LAB — URINE MICROSCOPIC-ADD ON

## 2013-06-30 LAB — COMPREHENSIVE METABOLIC PANEL
ALT: 11 U/L (ref 0–35)
Alkaline Phosphatase: 47 U/L (ref 39–117)
BUN: 10 mg/dL (ref 6–23)
Calcium: 9.2 mg/dL (ref 8.4–10.5)
Chloride: 104 mEq/L (ref 96–112)
GFR calc Af Amer: >90 mL/min (ref >90)
Glucose, Bld: 95 mg/dL (ref 70–99)
Potassium: 4.1 mEq/L (ref 3.5–5.1)
Sodium: 140 mEq/L (ref 135–145)
Total Bilirubin: 0.4 mg/dL (ref 0.3–1.2)
Total Protein: 6.9 g/dL (ref 6.0–8.3)

## 2013-06-30 LAB — URINALYSIS, ROUTINE W REFLEX MICROSCOPIC
Glucose, UA: NEGATIVE mg/dL
pH: 6 (ref 5.0–8.0)

## 2013-06-30 LAB — CBC
HCT: 39.8 % (ref 36.0–46.0)
Hemoglobin: 14.2 g/dL (ref 12.0–15.0)
MCH: 30.2 pg (ref 26.0–34.0)
MCHC: 35.7 g/dL (ref 30.0–36.0)
RBC: 4.7 MIL/uL (ref 3.87–5.11)
WBC: 10.1 10*3/uL (ref 4.0–10.5)

## 2013-06-30 LAB — POCT PREGNANCY, URINE: Preg Test, Ur: NEGATIVE

## 2013-06-30 SURGERY — REPAIR, HERNIA, UMBILICAL, ADULT
Anesthesia: General

## 2013-06-30 MED ORDER — SODIUM CHLORIDE 0.9 % IV SOLN
INTRAVENOUS | Status: DC
  Administered 2013-06-30: 11:00:00 via INTRAVENOUS

## 2013-06-30 MED ORDER — DOCUSATE SODIUM 100 MG PO CAPS: 100.0000 mg | ORAL_CAPSULE | Freq: Two times a day (BID) | ORAL | Status: DC

## 2013-06-30 MED ORDER — PROMETHAZINE HCL 25 MG/ML IJ SOLN
12.5000 mg | Freq: Once | INTRAMUSCULAR | Status: AC
  Administered 2013-06-30: 12.5 mg via INTRAVENOUS
  Filled 2013-06-30: qty 1

## 2013-06-30 MED ORDER — ONDANSETRON HCL 4 MG/2ML IJ SOLN
4.0000 mg | Freq: Once | INTRAMUSCULAR | Status: DC
  Filled 2013-06-30: qty 2

## 2013-06-30 MED ORDER — IOHEXOL 300 MG/ML  SOLN
50.0000 mL | INTRAMUSCULAR | Status: AC
  Administered 2013-06-30: 50 mL via ORAL

## 2013-06-30 MED ORDER — IOHEXOL 300 MG/ML  SOLN
100.0000 mL | Freq: Once | INTRAMUSCULAR | Status: AC | PRN
  Administered 2013-06-30: 100 mL via INTRAVENOUS

## 2013-06-30 MED ORDER — PROPOFOL 10 MG/ML IV BOLUS
INTRAVENOUS | Status: AC
  Filled 2013-06-30: qty 20

## 2013-06-30 MED ORDER — HYDROCODONE-ACETAMINOPHEN 5-325 MG PO TABS: 1.0000 | ORAL_TABLET | ORAL | Status: DC | PRN

## 2013-06-30 MED ORDER — KETOROLAC TROMETHAMINE 60 MG/2ML IM SOLN: 60.0000 mg | Freq: Once | INTRAMUSCULAR | Status: DC

## 2013-06-30 MED ORDER — METOCLOPRAMIDE HCL 5 MG/ML IJ SOLN
INTRAMUSCULAR | Status: AC
  Filled 2013-06-30: qty 2

## 2013-06-30 MED ORDER — MIDAZOLAM HCL 2 MG/2ML IJ SOLN
INTRAMUSCULAR | Status: AC
  Filled 2013-06-30: qty 2

## 2013-06-30 MED ORDER — MORPHINE SULFATE 4 MG/ML IJ SOLN
2.0000 mg | Freq: Once | INTRAMUSCULAR | Status: AC
  Administered 2013-06-30: 2 mg via INTRAVENOUS
  Filled 2013-06-30: qty 1

## 2013-06-30 MED ORDER — ONDANSETRON HCL 4 MG/2ML IJ SOLN
4.0000 mg | Freq: Once | INTRAMUSCULAR | Status: AC
  Administered 2013-06-30: 4 mg via INTRAVENOUS

## 2013-06-30 NOTE — ED Notes (Signed)
Patient with abdominal pain since December 1st.  Started with nausea and vomiting around 10 AM this morning went to [facility] hospital then sent here after umbilical hernia found.

## 2013-06-30 NOTE — MAU Provider Note (Signed)
Attestation of Attending Supervision of Advanced Practitioner (PA/CNM/NP): Evaluation and management procedures were performed by the Advanced Practitioner under my supervision and collaboration.  I have reviewed the Advanced Practitioner's note and chart, and I agree with the management and plan.  General Surgery contacted, patient will be sent to the [facility] ED for further evaluation and management of the periumbilical hernia with low grade small bowel obstruction.  Receiving attending physician is Dr. Ovidio Kin.  [facility] ED RN in charge notified. Patient agrees with this plan, has been NPO since 2000 last night . She will be transferred via [facility].   Jaynie Collins, MD, FACOG Attending Obstetrician & Gynecologist Faculty Practice, [facility]

## 2013-06-30 NOTE — ED Provider Notes (Signed)
MSE was initiated and I personally evaluated the patient and placed orders (if any) at  4:27 PM on June 30, 2013.  The patient appears stable so that the remainder of the MSE may be completed by another provider.  Patient was seen and evaluated by the [facility] surgery team for an incarcerated umbilical hernia.  On their evaluation the patient's hernia has self reduced.  The patient feels much better at this time.  I recommended stool softeners because she sometimes struggles with constipation.  She will followup with the general surgery office and she was instructed to return to the ER for any new or worsening symptoms including recurrent umbilical hernia with inability to reduce at home.   Lyanne Co, MD 06/30/13 [phone]

## 2013-06-30 NOTE — MAU Note (Addendum)
Pain above belly button started this morning.  "there is a knot there".  Pain is constant.  LMP was beginning of the month.

## 2013-06-30 NOTE — MAU Provider Note (Signed)
History     CSN: [id number]  Arrival date and time: 06/30/13 1191   First Provider Initiated Contact with Patient 06/30/13 1005      Chief Complaint  Patient presents with  . Abdominal Pain   HPI Pt is 32 yo not pregnant and present with umbilical pain with nausea.  Pt has not had fever or constipation or diarrhea. Pt had sudden onset of umbilical pain- pt took 2 advil this morning.  Pt last ate a 8:30 pm without nausea.  Pt's LMP was light 06/13/2013.  Pt was seen on 06/13/2013 for pain in her side.  Past Medical History  Diagnosis Date  . GERD (gastroesophageal reflux disease)   . Gastritis     Past Surgical History  Procedure Laterality Date  . Cholecystectomy      History reviewed. No pertinent family history.  History  Substance Use Topics  . Smoking status: Never Smoker   . Smokeless tobacco: Not on file  . Alcohol Use: No    Allergies: No Known Allergies  Prescriptions prior to admission  Medication Sig Dispense Refill  . ibuprofen (ADVIL,MOTRIN) 200 MG tablet Take 400 mg by mouth every 6 (six) hours as needed for mild pain or moderate pain.      . Prenatal Vit-Min-FA-Fish Oil ([employer] PRENATAL GUMMY PO) Take 2 tablets by mouth daily.        Review of Systems  Gastrointestinal: Positive for nausea, vomiting and abdominal pain.  Genitourinary: Negative for dysuria and urgency.   Physical Exam   Blood pressure 120/71, pulse 56, temperature 98.2 F (36.8 C), resp. rate 18, last menstrual period 06/13/2013.  Physical Exam  Vitals reviewed. Constitutional: She is oriented to person, place, and time. She appears well-developed and well-nourished. She appears distressed.  HENT:  Head: Normocephalic.  Eyes: Pupils are equal, round, and reactive to light.  Neck: Normal range of motion.  Cardiovascular: Normal rate.   Respiratory: Effort normal.  GI: Soft. There is tenderness.  Tender denisty about umbilicus  Musculoskeletal: Normal range of motion.   Neurological: She is alert and oriented to person, place, and time.  Skin: Skin is warm and dry. There is pallor.  Psychiatric: She has a normal mood and affect.    MAU Course  Procedures Discussed with Dr. Macon Large- who discussed with radiologist- will do CT with contrast Pt is having intense pain and nasuea and vomiting while in MAU- will start IV and give morphine and phenergan IV MDM Results for orders placed during the hospital encounter of 06/30/13 (from the past 24 hour(s))  URINALYSIS, ROUTINE W REFLEX MICROSCOPIC     Status: Abnormal   Collection Time    06/30/13  9:25 AM      Result Value Range   Color, Urine YELLOW  YELLOW   APPearance CLEAR  CLEAR   Specific Gravity, Urine >1.030 (*) 1.005 - 1.030   pH 6.0  5.0 - 8.0   Glucose, UA NEGATIVE  NEGATIVE mg/dL   Hgb urine dipstick TRACE (*) NEGATIVE   Bilirubin Urine NEGATIVE  NEGATIVE   Ketones, ur 15 (*) NEGATIVE mg/dL   Protein, ur NEGATIVE  NEGATIVE mg/dL   Urobilinogen, UA 0.2  0.0 - 1.0 mg/dL   Nitrite NEGATIVE  NEGATIVE   Leukocytes, UA MODERATE (*) NEGATIVE  URINE MICROSCOPIC-ADD ON     Status: Abnormal   Collection Time    06/30/13  9:25 AM      Result Value Range   Squamous Epithelial / LPF MANY (*)  RARE   WBC, UA 7-10  <3 WBC/hpf   RBC / HPF 0-2  <3 RBC/hpf   Bacteria, UA FEW (*) RARE   Urine-Other AMORPHOUS URATES/PHOSPHATES    POCT PREGNANCY, URINE     Status: None   Collection Time    06/30/13  9:31 AM      Result Value Range   Preg Test, Ur NEGATIVE  NEGATIVE  CBC     Status: None   Collection Time    06/30/13 10:11 AM      Result Value Range   WBC 10.1  4.0 - 10.5 K/uL   RBC 4.70  3.87 - 5.11 MIL/uL   Hemoglobin 14.2  12.0 - 15.0 g/dL   HCT 16.1  09.6 - 04.5 %   MCV 84.7  78.0 - 100.0 fL   MCH 30.2  26.0 - 34.0 pg   MCHC 35.7  30.0 - 36.0 g/dL   RDW 40.9  81.1 - 91.4 %   Platelets 212  150 - 400 K/uL  COMPREHENSIVE METABOLIC PANEL     Status: None   Collection Time    06/30/13 10:11 AM       Result Value Range   Sodium 140  135 - 145 mEq/L   Potassium 4.1  3.5 - 5.1 mEq/L   Chloride 104  96 - 112 mEq/L   CO2 28  19 - 32 mEq/L   Glucose, Bld 95  70 - 99 mg/dL   BUN 10  6 - 23 mg/dL   Creatinine, Ser 7.82  0.50 - 1.10 mg/dL   Calcium 9.2  8.4 - 95.6 mg/dL   Total Protein 6.9  6.0 - 8.3 g/dL   Albumin 4.1  3.5 - 5.2 g/dL   AST 14  0 - 37 U/L   ALT 11  0 - 35 U/L   Alkaline Phosphatase 47  39 - 117 U/L   Total Bilirubin 0.4  0.3 - 1.2 mg/dL   GFR calc non Af Amer >90  >90 mL/min   GFR calc Af Amer >90  >90 mL/min  Ct Abdomen Pelvis W Contrast  06/30/2013   CLINICAL DATA:  Acute onset of paraumbilical pain. Nausea and vomiting. Suspect umbilical hernia.  EXAM: CT ABDOMEN AND PELVIS WITH CONTRAST  TECHNIQUE: Multidetector CT imaging of the abdomen and pelvis was performed using the standard protocol following bolus administration of intravenous contrast.  CONTRAST:  OMNIPAQUE IOHEXOL 300 MG/ML  SOLN  COMPARISON:  None.  FINDINGS: A paraumbilical hernia is seen containing a loop of small bowel. There is mild dilatation proximal small bowel loops containing air-fluid levels. Distal small bowel loops are nondilated. This consistent with a low-grade small bowel obstruction. No evidence of bowel wall thickening or pneumatosis.  A small amount of free fluid is seen in the pelvis. Uterus and adnexal regions are otherwise unremarkable in appearance.  Surgical clips seen from prior cholecystectomy. The liver, pancreas, spleen, adrenal glands, and kidneys are normal in appearance. No evidence of hydronephrosis. No soft tissue masses or lymphadenopathy identified.  IMPRESSION: Small paraumbilical hernia containing a loop of small bowel, and causing a low-grade small bowel obstruction.  Smaller free fluid in pelvis. No evidence of inflammatory process or abscess.   Electronically Signed   By: Myles Rosenthal M.D.   On: 06/30/2013 13:57  Dr. Macon Large informed of CT report- will contact surgeon  on call Assessment and Plan    [name] 06/30/2013, 10:05 AM

## 2013-06-30 NOTE — Consult Note (Signed)
[name] 1981-05-26  [id number].   Primary Care MD: None Requesting MD: Dr. Macon Large Chief Complaint/Reason for Consult: incarcerated umbilical hernia HPI: This is a 32 year old otherwise healthy white female who awoke this morning and took her children to school. During that time, she noticed some umbilical pain. She noticed a bulge. She continued to have abdominal pain and presented to the MAU at [facility]. She was felt to have a possible hernia and a CT scan was obtained. During this time she developed nausea but no emesis. She denies having a bowel movement or passing flatus today. Her CT scan was obtained and revealed an incarcerated umbilical hernia with a loop of small bowel present in the hernia. There is a low-grade small bowel obstruction noted secondary to this hernia. We were contacted by the GYN doctor on call at [facility]. Transfer was requested to [facility] for evaluation and possible repair.  ROS: Please see history of present illness, otherwise all other systems have been reviewed and are negative.  History reviewed. No pertinent family history.  Past Medical History  Diagnosis Date  . GERD (gastroesophageal reflux disease)   . Gastritis     Past Surgical History  Procedure Laterality Date  . Cholecystectomy      Social History:  reports that she has never smoked. She does not have any smokeless tobacco history on file. She reports that she does not drink alcohol or use illicit drugs.  Allergies: No Known Allergies   (Not in a hospital admission)  Blood pressure 101/50, pulse 61, temperature 98.2 F (36.8 C), temperature source Oral, resp. rate 16, last menstrual period 06/13/2013, SpO2 96.00%. Physical Exam: General: pleasant, WD, WN white female who is laying in bed in NAD HEENT: head is normocephalic, atraumatic.  Sclera are noninjected.  PERRL.  Ears and nose without any masses or lesions.  Mouth is pink and moist Heart: regular,  rate, and rhythm.  Normal s1,s2. +Murmur. No obvious gallops, or rubs noted.  Palpable radial and pedal pulses bilaterally Lungs: CTAB, no wheezes, rhonchi, or rales noted.  Respiratory effort nonlabored Abd: soft, NT, ND, +BS, no masses or organomegaly.  She has an umbilical hernia defect noted.  Her hernia has reduced. MS: all 4 extremities are symmetrical with no cyanosis, clubbing, or edema. Skin: warm and dry with no masses, lesions, or rashes Psych: A&Ox3 with an appropriate affect.   Results for orders placed during the hospital encounter of 06/30/13 (from the past 48 hour(s))  URINALYSIS, ROUTINE W REFLEX MICROSCOPIC     Status: Abnormal   Collection Time    06/30/13  9:25 AM      Result Value Range   Color, Urine YELLOW  YELLOW   APPearance CLEAR  CLEAR   Specific Gravity, Urine >1.030 (*) 1.005 - 1.030   pH 6.0  5.0 - 8.0   Glucose, UA NEGATIVE  NEGATIVE mg/dL   Hgb urine dipstick TRACE (*) NEGATIVE   Bilirubin Urine NEGATIVE  NEGATIVE   Ketones, ur 15 (*) NEGATIVE mg/dL   Protein, ur NEGATIVE  NEGATIVE mg/dL   Urobilinogen, UA 0.2  0.0 - 1.0 mg/dL   Nitrite NEGATIVE  NEGATIVE   Leukocytes, UA MODERATE (*) NEGATIVE  URINE MICROSCOPIC-ADD ON     Status: Abnormal   Collection Time    06/30/13  9:25 AM      Result Value Range   Squamous Epithelial / LPF MANY (*) RARE   WBC, UA 7-10  <3 WBC/hpf  RBC / HPF 0-2  <3 RBC/hpf   Bacteria, UA FEW (*) RARE   Urine-Other AMORPHOUS URATES/PHOSPHATES     Comment: MUCOUS PRESENT  POCT PREGNANCY, URINE     Status: None   Collection Time    06/30/13  9:31 AM      Result Value Range   Preg Test, Ur NEGATIVE  NEGATIVE   Comment:            THE SENSITIVITY OF THIS     METHODOLOGY IS >24 mIU/mL  CBC     Status: None   Collection Time    06/30/13 10:11 AM      Result Value Range   WBC 10.1  4.0 - 10.5 K/uL   RBC 4.70  3.87 - 5.11 MIL/uL   Hemoglobin 14.2  12.0 - 15.0 g/dL   HCT 16.1  09.6 - 04.5 %   MCV 84.7  78.0 - 100.0 fL    MCH 30.2  26.0 - 34.0 pg   MCHC 35.7  30.0 - 36.0 g/dL   RDW 40.9  81.1 - 91.4 %   Platelets 212  150 - 400 K/uL  COMPREHENSIVE METABOLIC PANEL     Status: None   Collection Time    06/30/13 10:11 AM      Result Value Range   Sodium 140  135 - 145 mEq/L   Potassium 4.1  3.5 - 5.1 mEq/L   Chloride 104  96 - 112 mEq/L   CO2 28  19 - 32 mEq/L   Glucose, Bld 95  70 - 99 mg/dL   BUN 10  6 - 23 mg/dL   Creatinine, Ser 7.82  0.50 - 1.10 mg/dL   Calcium 9.2  8.4 - 95.6 mg/dL   Total Protein 6.9  6.0 - 8.3 g/dL   Albumin 4.1  3.5 - 5.2 g/dL   AST 14  0 - 37 U/L   ALT 11  0 - 35 U/L   Alkaline Phosphatase 47  39 - 117 U/L   Total Bilirubin 0.4  0.3 - 1.2 mg/dL   GFR calc non Af Amer >90  >90 mL/min   GFR calc Af Amer >90  >90 mL/min   Comment: (NOTE)     The eGFR has been calculated using the CKD EPI equation.     This calculation has not been validated in all clinical situations.     eGFR's persistently <90 mL/min signify possible Chronic Kidney     Disease.   Ct Abdomen Pelvis W Contrast  06/30/2013   CLINICAL DATA:  Acute onset of paraumbilical pain. Nausea and vomiting. Suspect umbilical hernia.  EXAM: CT ABDOMEN AND PELVIS WITH CONTRAST  TECHNIQUE: Multidetector CT imaging of the abdomen and pelvis was performed using the standard protocol following bolus administration of intravenous contrast.  CONTRAST:  OMNIPAQUE IOHEXOL 300 MG/ML  SOLN  COMPARISON:  None.  FINDINGS: A paraumbilical hernia is seen containing a loop of small bowel. There is mild dilatation proximal small bowel loops containing air-fluid levels. Distal small bowel loops are nondilated. This consistent with a low-grade small bowel obstruction. No evidence of bowel wall thickening or pneumatosis.  A small amount of free fluid is seen in the pelvis. Uterus and adnexal regions are otherwise unremarkable in appearance.  Surgical clips seen from prior cholecystectomy. The liver, pancreas, spleen, adrenal glands, and  kidneys are normal in appearance. No evidence of hydronephrosis. No soft tissue masses or lymphadenopathy identified.  IMPRESSION: Small paraumbilical hernia containing a  loop of small bowel, and causing a low-grade small bowel obstruction.  Smaller free fluid in pelvis. No evidence of inflammatory process or abscess.   Electronically Signed   By: Myles Rosenthal M.D.   On: 06/30/2013 13:57       Assessment/Plan 1. Umbilical hernia, reduced 2. GERD 3. Heart murmur  Plan: By the time the patient arrived to [facility], her her knee I had reduced on its own. The patient currently is nontender and has no complaints. Dr. Ezzard Standing and I have evaluated the patient. At this time, we filled the patient is okay for discharge home. We have discussed with her the possibility of bringing her back tomorrow to repair this or calling our office to schedule an elective repair sometime early in the new year in 2015. The patient would prefer to set up an elective repair early next year. ]  We will let her go home. We have thoroughly discussed warning signs that she needs to look for including nausea vomiting umbilical bulge, and pain at this site and an inability to reduce this bulge. She has been told if this occurs to call our office or come to the emergency department. The patient understands all of this and is agreeable to call our office to set up an elective repair.  OSBORNE,KELLY E 06/30/2013, 4:26 PM Pager: [phone]  Agree with above. Dr. Michaell Cowing did her lap chole in 2007. Her sister, Zella Ball, is with her.  She is divorcing from her husband.  She works as a Control and instrumentation engineer". She has our phone number and is to call our office to schedule surgery for her umbilical hernia for early next year (2105) and pick up information on hernia repairs.  Ovidio Kin, MD, [facility] Surgery Pager: [phone] Office phone:  [phone]

## 2013-07-01 LAB — URINE CULTURE

## 2013-07-04 ENCOUNTER — Telehealth (INDEPENDENT_AMBULATORY_CARE_PROVIDER_SITE_OTHER): Payer: Self-pay | Admitting: Surgery

## 2013-07-04 NOTE — Telephone Encounter (Signed)
Patient will call to schedule per face sheet

## 2013-08-25 ENCOUNTER — Inpatient Hospital Stay (HOSPITAL_COMMUNITY): Payer: Self-pay

## 2013-08-25 ENCOUNTER — Encounter (HOSPITAL_COMMUNITY): Payer: Self-pay | Admitting: *Deleted

## 2013-08-25 ENCOUNTER — Inpatient Hospital Stay (HOSPITAL_COMMUNITY)
Admission: AD | Admit: 2013-08-25 | Discharge: 2013-08-25 | Disposition: A | Discharge status: Home / Self Care | Payer: Self-pay | Source: Ambulatory Visit | Attending: Obstetrics & Gynecology | Admitting: Obstetrics & Gynecology

## 2013-08-25 DIAGNOSIS — O9989 Other specified diseases and conditions complicating pregnancy, childbirth and the puerperium: Principal | ICD-10-CM

## 2013-08-25 DIAGNOSIS — K5900 Constipation, unspecified: Secondary | ICD-10-CM | POA: Insufficient documentation

## 2013-08-25 DIAGNOSIS — K219 Gastro-esophageal reflux disease without esophagitis: Secondary | ICD-10-CM | POA: Insufficient documentation

## 2013-08-25 DIAGNOSIS — O26899 Other specified pregnancy related conditions, unspecified trimester: Secondary | ICD-10-CM

## 2013-08-25 DIAGNOSIS — O99891 Other specified diseases and conditions complicating pregnancy: Secondary | ICD-10-CM | POA: Insufficient documentation

## 2013-08-25 DIAGNOSIS — K5909 Other constipation: Secondary | ICD-10-CM

## 2013-08-25 DIAGNOSIS — O30009 Twin pregnancy, unspecified number of placenta and unspecified number of amniotic sacs, unspecified trimester: Secondary | ICD-10-CM | POA: Insufficient documentation

## 2013-08-25 DIAGNOSIS — R109 Unspecified abdominal pain: Secondary | ICD-10-CM | POA: Insufficient documentation

## 2013-08-25 HISTORY — DX: Umbilical hernia without obstruction or gangrene: K42.9

## 2013-08-25 LAB — CBC
HCT: 33.8 % — ABNORMAL LOW (ref 36.0–46.0)
HEMOGLOBIN: 11.8 g/dL — AB (ref 12.0–15.0)
MCH: 29.4 pg (ref 26.0–34.0)
MCHC: 34.9 g/dL (ref 30.0–36.0)
MCV: 84.1 fL (ref 78.0–100.0)
Platelets: 175 10*3/uL (ref 150–400)
RBC: 4.02 MIL/uL (ref 3.87–5.11)
RDW: 12.3 % (ref 11.5–15.5)
WBC: 8.4 10*3/uL (ref 4.0–10.5)

## 2013-08-25 LAB — WET PREP, GENITAL
Clue Cells Wet Prep HPF POC: NONE SEEN
Trich, Wet Prep: NONE SEEN
Yeast Wet Prep HPF POC: NONE SEEN

## 2013-08-25 LAB — POCT PREGNANCY, URINE: PREG TEST UR: POSITIVE — AB

## 2013-08-25 LAB — URINALYSIS, ROUTINE W REFLEX MICROSCOPIC
Bilirubin Urine: NEGATIVE
GLUCOSE, UA: NEGATIVE mg/dL
Hgb urine dipstick: NEGATIVE
Ketones, ur: NEGATIVE mg/dL
Leukocytes, UA: NEGATIVE
Nitrite: NEGATIVE
PROTEIN: NEGATIVE mg/dL
Specific Gravity, Urine: >1.03 — ABNORMAL HIGH (ref 1.005–1.030)
Urobilinogen, UA: 0.2 mg/dL (ref 0.0–1.0)
pH: 5.5 (ref 5.0–8.0)

## 2013-08-25 LAB — HCG, QUANTITATIVE, PREGNANCY: HCG, BETA CHAIN, QUANT, S: 127850 m[IU]/mL — AB (ref <5)

## 2013-08-25 MED ORDER — DOCUSATE SODIUM 100 MG PO CAPS: 100.0000 mg | ORAL_CAPSULE | Freq: Two times a day (BID) | ORAL | Status: AC | PRN

## 2013-08-25 NOTE — MAU Provider Note (Signed)
Chief Complaint: Vaginal Pain   None    SUBJECTIVE HPI: [name] is a 33 y.o. [phone] at 3469w2d by LMP who presents to maternity admissions reporting cramping abdominal pain intermittent for last 3 days, shooting into vagina.  She denies vaginal bleeding, vaginal itching/burning, urinary symptoms, h/a, dizziness, n/v, or fever/chills.  She reports chronic constipation and is concerned about safe medications in pregnancy.   Past Medical History  Diagnosis Date  . GERD (gastroesophageal reflux disease)   . Gastritis   . Umbilical hernia    Past Surgical History  Procedure Laterality Date  . Cholecystectomy     History   Social History  . Marital Status: Single    Spouse Name: N/A    Number of Children: N/A  . Years of Education: N/A   Occupational History  . Not on file.   Social History Main Topics  . Smoking status: Never Smoker   . Smokeless tobacco: Not on file  . Alcohol Use: No  . Drug Use: No  . Sexual Activity: Yes    Birth Control/ Protection: None   Other Topics Concern  . Not on file   Social History Narrative  . No narrative on file   No current facility-administered medications on file prior to encounter.   No current outpatient prescriptions on file prior to encounter.   No Known Allergies  ROS: Pertinent items in HPI  OBJECTIVE Blood pressure 120/58, pulse 58, temperature 98.1 F (36.7 C), temperature source Oral, resp. rate 18, height 5' 5.5" (1.664 m), weight 67.405 kg (148 lb 9.6 oz), last menstrual period 07/05/2013. GENERAL: Well-developed, well-nourished female in no acute distress.  HEENT: Normocephalic HEART: normal rate RESP: normal effort ABDOMEN: Soft, non-tender EXTREMITIES: Nontender, no edema NEURO: Alert and oriented Pelvic exam: Cervix pink, visually closed, without lesion, scant white creamy discharge, vaginal walls and external genitalia normal Bimanual exam: Cervix 0/long/high, firm, anterior, neg CMT, uterus nontender,  ~ 7 week size, adnexa without tenderness, enlargement, or mass LAB RESULTS Results for orders placed during the hospital encounter of 08/25/13 (from the past 24 hour(s))  URINALYSIS, ROUTINE W REFLEX MICROSCOPIC     Status: Abnormal   Collection Time    08/25/13  4:45 PM      Result Value Ref Range   Color, Urine YELLOW  YELLOW   APPearance CLEAR  CLEAR   Specific Gravity, Urine >1.030 (*) 1.005 - 1.030   pH 5.5  5.0 - 8.0   Glucose, UA NEGATIVE  NEGATIVE mg/dL   Hgb urine dipstick NEGATIVE  NEGATIVE   Bilirubin Urine NEGATIVE  NEGATIVE   Ketones, ur NEGATIVE  NEGATIVE mg/dL   Protein, ur NEGATIVE  NEGATIVE mg/dL   Urobilinogen, UA 0.2  0.0 - 1.0 mg/dL   Nitrite NEGATIVE  NEGATIVE   Leukocytes, UA NEGATIVE  NEGATIVE  POCT PREGNANCY, URINE     Status: Abnormal   Collection Time    08/25/13  5:14 PM      Result Value Ref Range   Preg Test, Ur POSITIVE (*) NEGATIVE  CBC     Status: Abnormal   Collection Time    08/25/13  5:21 PM      Result Value Ref Range   WBC 8.4  4.0 - 10.5 K/uL   RBC 4.02  3.87 - 5.11 MIL/uL   Hemoglobin 11.8 (*) 12.0 - 15.0 g/dL   HCT 56.233.8 (*) 13.036.0 - 86.546.0 %   MCV 84.1  78.0 - 100.0 fL   MCH 29.4  26.0 - 34.0 pg   MCHC 34.9  30.0 - 36.0 g/dL   RDW 16.1  09.6 - 04.5 %   Platelets 175  150 - 400 K/uL  WET PREP, GENITAL     Status: Abnormal   Collection Time    08/25/13  5:30 PM      Result Value Ref Range   Yeast Wet Prep HPF POC NONE SEEN  NONE SEEN   Trich, Wet Prep NONE SEEN  NONE SEEN   Clue Cells Wet Prep HPF POC NONE SEEN  NONE SEEN   WBC, Wet Prep HPF POC MANY (*) NONE SEEN    IMAGING US Ob Comp Less 14 Wks  08/25/2013   CLINICAL DATA:  Abdominal pain. Seven weeks pregnant based on last menstrual cycle.  EXAM: OBSTETRIC <14 WK ULTRASOUND  TECHNIQUE: Transabdominal ultrasound was performed for evaluation of the gestation as well as the maternal uterus and adnexal regions.  COMPARISON:  None.  FINDINGS: Intrauterine gestational sac: 2 contiguous  gestational sacs, which superior diamniotic/dichorionic.  Yolk sac: 2 discrete yolk sacs visualized 1 in each gestational sac.  Embryo:  2 discrete embryo as, 1 in each gestational sac.  Cardiac Activity: Yes for each embryo  Heart Rate: Twin A,  143 beats per min.  Twin B, 155 bpm  CRL-A: 10.2  mm   7 w   1  d  CRL-B: 12.3 mm 7 w 4 d [place] EDC: A- 04/12/2014. B- 04/09/2014  Maternal uterus/adnexae: No subchronic hemorrhage. No uterine mass. Normal right ovary. Left ovary not visualized. No adnexal mass. No pelvic free fluid.  IMPRESSION: 1. Twin, live intrauterine pregnancies, diamniotic and dichorionic. 2. No emergent pregnancy or maternal complication.   Electronically Signed   By: Amie Portland M.D.   On: 08/25/2013 18:32    ASSESSMENT 1. Abdominal pain in pregnancy   2. Pregnancy, twins   3. Chronic constipation     PLAN Discharge home Colace BID PRN, Ok to take Miralax as needed Increase fiber intake F/U with [facility] as scheduled Return to MAU as needed    Medication List    STOP taking these medications       bisacodyl 5 MG EC tablet  Commonly known as:  DULCOLAX      TAKE these medications       docusate sodium 100 MG capsule  Commonly known as:  COLACE  Take 1 capsule (100 mg total) by mouth 2 (two) times daily as needed.     prenatal multivitamin Tabs tablet  Take 1 tablet by mouth daily at 12 noon.           Follow-up Information   Follow up with [facility]. (As scheduled. Return to MAU as needed.)    Contact information:   [address] [place] [address] [phone]      Sharen Counter Certified Nurse-Midwife 08/25/2013  6:54 PM

## 2013-08-25 NOTE — Discharge Instructions (Signed)
You may take Metamucil or Miralax in pregnancy.  Increase fiber in diet, including fruits with skin, high fiber cereals.    Abdominal Pain During Pregnancy Abdominal pain is common in pregnancy. Most of the time, it does not cause harm. There are many causes of abdominal pain. Some causes are more serious than others. Some of the causes of abdominal pain in pregnancy are easily diagnosed. Occasionally, the diagnosis takes time to understand. Other times, the cause is not determined. Abdominal pain can be a sign that something is very wrong with the pregnancy, or the pain may have nothing to do with the pregnancy at all. For this reason, always tell your health care provider if you have any abdominal discomfort. HOME CARE INSTRUCTIONS  Monitor your abdominal pain for any changes. The following actions may help to alleviate any discomfort you are experiencing:  Do not have sexual intercourse or put anything in your vagina until your symptoms go away completely.  Get plenty of rest until your pain improves.  Drink clear fluids if you feel nauseous. Avoid solid food as long as you are uncomfortable or nauseous.  Only take over-the-counter or prescription medicine as directed by your health care provider.  Keep all follow-up appointments with your health care provider. SEEK IMMEDIATE MEDICAL CARE IF:  You are bleeding, leaking fluid, or passing tissue from the vagina.  You have increasing pain or cramping.  You have persistent vomiting.  You have painful or bloody urination.  You have a fever.  You notice a decrease in your baby's movements.  You have extreme weakness or feel faint.  You have shortness of breath, with or without abdominal pain.  You develop a severe headache with abdominal pain.  You have abnormal vaginal discharge with abdominal pain.  You have persistent diarrhea.  You have abdominal pain that continues even after rest, or gets worse. MAKE SURE YOU:    Understand these instructions.  Will watch your condition.  Will get help right away if you are not doing well or get worse. Document Released: 06/30/2005 Document Revised: 04/20/2013 Document Reviewed: 01/27/2013 [facility] Patient Information 2014 [place], [place]. Constipation, Adult Constipation is when a person has fewer than 3 bowel movements a week; has difficulty having a bowel movement; or has stools that are dry, hard, or larger than normal. As people grow older, constipation is more common. If you try to fix constipation with medicines that make you have a bowel movement (laxatives), the problem may get worse. Long-term laxative use may cause the muscles of the colon to become weak. A low-fiber diet, not taking in enough fluids, and taking certain medicines may make constipation worse. CAUSES   Certain medicines, such as antidepressants, pain medicine, iron supplements, antacids, and water pills.   Certain diseases, such as diabetes, irritable bowel syndrome (IBS), thyroid disease, or depression.   Not drinking enough water.   Not eating enough fiber-rich foods.   Stress or travel.  Lack of physical activity or exercise.  Not going to the restroom when there is the urge to have a bowel movement.  Ignoring the urge to have a bowel movement.  Using laxatives too much. SYMPTOMS   Having fewer than 3 bowel movements a week.   Straining to have a bowel movement.   Having hard, dry, or larger than normal stools.   Feeling full or bloated.   Pain in the lower abdomen.  Not feeling relief after having a bowel movement. DIAGNOSIS  Your caregiver will take a  medical history and perform a physical exam. Further testing may be done for severe constipation. Some tests may include:   A barium enema X-ray to examine your rectum, colon, and sometimes, your small intestine.  A sigmoidoscopy to examine your lower colon.  A colonoscopy to examine your entire  colon. TREATMENT  Treatment will depend on the severity of your constipation and what is causing it. Some dietary treatments include drinking more fluids and eating more fiber-rich foods. Lifestyle treatments may include regular exercise. If these diet and lifestyle recommendations do not help, your caregiver may recommend taking over-the-counter laxative medicines to help you have bowel movements. Prescription medicines may be prescribed if over-the-counter medicines do not work.  HOME CARE INSTRUCTIONS   Increase dietary fiber in your diet, such as fruits, vegetables, whole grains, and beans. Limit high-fat and processed sugars in your diet, such as [place] fries, hamburgers, cookies, candies, and soda.   A fiber supplement may be added to your diet if you cannot get enough fiber from foods.   Drink enough fluids to keep your urine clear or pale yellow.   Exercise regularly or as directed by your caregiver.   Go to the restroom when you have the urge to go. Do not hold it.  Only take medicines as directed by your caregiver. Do not take other medicines for constipation without talking to your caregiver first. SEEK IMMEDIATE MEDICAL CARE IF:   You have bright red blood in your stool.   Your constipation lasts for more than 4 days or gets worse.   You have abdominal or rectal pain.   You have thin, pencil-like stools.  You have unexplained weight loss. MAKE SURE YOU:   Understand these instructions.  Will watch your condition.  Will get help right away if you are not doing well or get worse. Document Released: 03/28/2004 Document Revised: 09/22/2011 Document Reviewed: 04/11/2013 [facility] Patient Information 2014 [place].

## 2013-08-25 NOTE — MAU Note (Signed)
Pt stated she has been having some cramping and vaginal pain on and off since yesterday. Denies any vaginal bleeding or discharge.

## 2013-08-26 LAB — GC/CHLAMYDIA PROBE AMP
CT Probe RNA: NEGATIVE
GC Probe RNA: NEGATIVE

## 2013-08-26 NOTE — MAU Provider Note (Signed)
Attestation of Attending Supervision of Advanced Practitioner (PA/CNM/NP): Evaluation and management procedures were performed by the Advanced Practitioner under my supervision and collaboration.  I have reviewed the Advanced Practitioner's note and chart, and I agree with the management and plan.  Shatasha Lambing, MD, FACOG Attending Obstetrician & Gynecologist Faculty Practice, [facility]  

## 2013-09-12 ENCOUNTER — Ambulatory Visit (INDEPENDENT_AMBULATORY_CARE_PROVIDER_SITE_OTHER): Payer: BC Managed Care – PPO | Admitting: Obstetrics

## 2013-09-12 ENCOUNTER — Encounter: Payer: Self-pay | Admitting: Obstetrics

## 2013-09-12 ENCOUNTER — Ambulatory Visit: Payer: Self-pay | Admitting: Obstetrics

## 2013-09-12 VITALS — BP 108/73 | Wt 149.0 lb

## 2013-09-12 DIAGNOSIS — Z3480 Encounter for supervision of other normal pregnancy, unspecified trimester: Secondary | ICD-10-CM

## 2013-09-12 DIAGNOSIS — IMO0001 Reserved for inherently not codable concepts without codable children: Secondary | ICD-10-CM

## 2013-09-12 DIAGNOSIS — Z3201 Encounter for pregnancy test, result positive: Secondary | ICD-10-CM

## 2013-09-12 LAB — POCT URINALYSIS DIPSTICK
BILIRUBIN UA: NEGATIVE
Blood, UA: NEGATIVE
Glucose, UA: NEGATIVE
KETONES UA: NEGATIVE
LEUKOCYTES UA: NEGATIVE
Nitrite, UA: NEGATIVE
Protein, UA: NEGATIVE
Spec Grav, UA: 1.02
Urobilinogen, UA: NEGATIVE
pH, UA: 5

## 2013-09-12 NOTE — Progress Notes (Signed)
Subjective:       Pulse 5161   [name] is being seen today for her first obstetrical visit.  This is a planned pregnancy. She is at 1161w6d gestation. Her obstetrical history is significant for none. Relationship with FOB: significant other, living together. Patient does intend to breast feed.  [facility] and was given positive pregnancy results.  Pt was told she was having twins.  U/s confirmed twin gestation at 7 weeks.  Pt would like full exam today.  Pt is moving to [place] in 2 weeks and will need records available.   Pregnancy history fully reviewed.  Menstrual History: OB History   Grav Para Term Preterm Abortions TAB SAB Ect Mult Living   5 3 3  1  1   3       Menarche age: [age] Patient's last menstrual period was 07/05/2013.    The following portions of the patient's history were reviewed and updated as appropriate: allergies, current medications, past family history, past medical history, past social history, past surgical history and problem list.  Review of Systems Pertinent items are noted in HPI.    Objective:    General appearance: alert and no distress Breasts: normal appearance, no masses or tenderness Abdomen: normal findings: soft, non-tender Pelvic: cervix normal in appearance, external genitalia normal, no adnexal masses or tenderness, no cervical motion tenderness, rectovaginal septum normal, uterus normal size, shape, and consistency and vagina normal without discharge Extremities: extremities normal, atraumatic, no cyanosis or edema    Assessment:    Pregnancy at 3961w6d weeks    Plan:    Initial labs drawn. Prenatal vitamins.  Counseling provided regarding continued use of seat belts, cessation of alcohol consumption, smoking or use of illicit drugs; infection precautions i.e., influenza/TDAP immunizations, toxoplasmosis,CMV, parvovirus, listeria and varicella; workplace safety, exercise during pregnancy; routine dental care, safe  medications, sexual activity, hot tubs, saunas, pools, travel, caffeine use, fish and methlymercury, potential toxins, hair treatments, varicose veins Weight gain recommendations per IOM guidelines reviewed: underweight/BMI< 18.5--> gain 28 - 40 lbs; normal weight/BMI 18.5 - 24.9--> gain 25 - 35 lbs; overweight/BMI 25 - 29.9--> gain 15 - 25 lbs; obese/BMI >30->gain  11 - 20 lbs Problem list reviewed and updated. FIRST/CF mutation testing/NIPT/QUAD SCREEN discussed: requested. Role of ultrasound in pregnancy discussed; fetal survey: requested. Amniocentesis discussed: not indicated. VBAC calculator score: VBAC consent form provided Follow up in 4 weeks. 50% of 20 min visit spent on counseling and coordination of care.

## 2013-09-12 NOTE — Progress Notes (Signed)
Twins

## 2013-09-12 NOTE — Addendum Note (Signed)
Addended by: Marya LandryFOSTER, Asma Boldon D on: 09/12/2013 02:11 PM   Modules accepted: Orders

## 2013-09-13 LAB — WET PREP BY MOLECULAR PROBE
Candida species: NEGATIVE
GARDNERELLA VAGINALIS: NEGATIVE
TRICHOMONAS VAG: NEGATIVE

## 2013-09-13 LAB — OBSTETRIC PANEL
Antibody Screen: NEGATIVE
Basophils Absolute: 0 10*3/uL (ref 0.0–0.1)
Basophils Relative: 0 % (ref 0–1)
EOS PCT: 1 % (ref 0–5)
Eosinophils Absolute: 0.1 10*3/uL (ref 0.0–0.7)
HCT: 40 % (ref 36.0–46.0)
HEMOGLOBIN: 14.1 g/dL (ref 12.0–15.0)
Hepatitis B Surface Ag: NEGATIVE
LYMPHS ABS: 1.6 10*3/uL (ref 0.7–4.0)
Lymphocytes Relative: 20 % (ref 12–46)
MCH: 30.1 pg (ref 26.0–34.0)
MCHC: 35.3 g/dL (ref 30.0–36.0)
MCV: 85.3 fL (ref 78.0–100.0)
MONOS PCT: 5 % (ref 3–12)
Monocytes Absolute: 0.4 10*3/uL (ref 0.1–1.0)
Neutro Abs: 5.9 10*3/uL (ref 1.7–7.7)
Neutrophils Relative %: 74 % (ref 43–77)
Platelets: 211 10*3/uL (ref 150–400)
RBC: 4.69 MIL/uL (ref 3.87–5.11)
RDW: 13.2 % (ref 11.5–15.5)
RH TYPE: POSITIVE
RUBELLA: 1.23 {index} — AB (ref <0.90)
WBC: 8 10*3/uL (ref 4.0–10.5)

## 2013-09-13 LAB — GC/CHLAMYDIA PROBE AMP
CT PROBE, AMP APTIMA: NEGATIVE
GC Probe RNA: NEGATIVE

## 2013-09-13 LAB — CULTURE, OB URINE
Colony Count: NO GROWTH
ORGANISM ID, BACTERIA: NO GROWTH

## 2013-09-13 LAB — VITAMIN D 25 HYDROXY (VIT D DEFICIENCY, FRACTURES): VIT D 25 HYDROXY: 36 ng/mL (ref 30–89)

## 2013-09-13 LAB — PAP IG W/ RFLX HPV ASCU

## 2013-09-13 LAB — VARICELLA ZOSTER ANTIBODY, IGG: Varicella IgG: 564.5 Index — ABNORMAL HIGH (ref <135.00)

## 2013-09-13 LAB — HIV ANTIBODY (ROUTINE TESTING W REFLEX): HIV: NONREACTIVE

## 2013-09-14 LAB — HEMOGLOBINOPATHY EVALUATION
HEMOGLOBIN OTHER: 0 %
Hgb A2 Quant: 2.8 % (ref 2.2–3.2)
Hgb A: 97.2 % (ref 96.8–97.8)
Hgb F Quant: 0 % (ref 0.0–2.0)
Hgb S Quant: 0 %

## 2013-09-20 ENCOUNTER — Encounter: Payer: BC Managed Care – PPO | Admitting: Obstetrics

## 2013-10-03 ENCOUNTER — Emergency Department
Admission: EM | Admit: 2013-10-03 | Discharge: 2013-10-04 | Disposition: A | Discharge status: Home / Self Care | Payer: BC Managed Care – PPO | Attending: Emergency Medicine | Admitting: Emergency Medicine

## 2013-10-03 ENCOUNTER — Emergency Department: Payer: BC Managed Care – PPO

## 2013-10-03 DIAGNOSIS — O219 Vomiting of pregnancy, unspecified: Secondary | ICD-10-CM

## 2013-10-03 DIAGNOSIS — Z3490 Encounter for supervision of normal pregnancy, unspecified, unspecified trimester: Secondary | ICD-10-CM

## 2013-10-03 DIAGNOSIS — K429 Umbilical hernia without obstruction or gangrene: Secondary | ICD-10-CM

## 2013-10-03 DIAGNOSIS — R112 Nausea with vomiting, unspecified: Secondary | ICD-10-CM | POA: Insufficient documentation

## 2013-10-03 DIAGNOSIS — N390 Urinary tract infection, site not specified: Secondary | ICD-10-CM

## 2013-10-03 DIAGNOSIS — O2390 Unspecified genitourinary tract infection in pregnancy, unspecified trimester: Secondary | ICD-10-CM | POA: Insufficient documentation

## 2013-10-03 LAB — URINALYSIS, REFLEX TO MICROSCOPIC EXAM IF INDICATED
Bilirubin, UA: NEGATIVE
Blood, UA: NEGATIVE
Glucose, UA: 50 — AB
Ketones UA: 80
Nitrite, UA: NEGATIVE
Protein, UR: 100 — AB
Specific Gravity UA: 1.034 (ref 1.001–1.035)
Urine pH: 6 (ref 5.0–8.0)
Urobilinogen, UA: NEGATIVE mg/dL

## 2013-10-03 LAB — COMPREHENSIVE METABOLIC PANEL
ALT: 18 U/L (ref 0–55)
AST (SGOT): 17 U/L (ref 5–34)
Albumin/Globulin Ratio: 1.3 (ref 0.9–2.2)
Albumin: 3.5 g/dL (ref 3.5–5.0)
Alkaline Phosphatase: 46 U/L (ref 40–150)
Anion Gap: 11 (ref 5.0–15.0)
BUN: 10 mg/dL (ref 7.0–19.0)
Bilirubin, Total: 0.5 mg/dL (ref 0.2–1.2)
CO2: 23 mEq/L (ref 22–29)
Calcium: 8.9 mg/dL (ref 8.5–10.5)
Chloride: 104 mEq/L (ref 98–107)
Creatinine: 0.7 mg/dL (ref 0.6–1.0)
Globulin: 2.8 g/dL (ref 2.0–3.6)
Glucose: 119 mg/dL — ABNORMAL HIGH (ref 70–100)
Potassium: 4 mEq/L (ref 3.5–5.1)
Protein, Total: 6.3 g/dL (ref 6.0–8.3)
Sodium: 138 mEq/L (ref 136–145)

## 2013-10-03 LAB — CBC AND DIFFERENTIAL
Basophils Absolute Automated: 0.02 10*3/uL (ref 0.00–0.20)
Basophils Automated: 0 %
Eosinophils Absolute Automated: 0 10*3/uL (ref 0.00–0.70)
Eosinophils Automated: 0 %
Hematocrit: 37.8 % (ref 37.0–47.0)
Hgb: 13.5 g/dL (ref 12.0–16.0)
Immature Granulocytes Absolute: 0.02 10*3/uL
Immature Granulocytes: 0 %
Lymphocytes Absolute Automated: 0.82 10*3/uL (ref 0.50–4.40)
Lymphocytes Automated: 8 %
MCH: 29.7 pg (ref 28.0–32.0)
MCHC: 35.7 g/dL (ref 32.0–36.0)
MCV: 83.1 fL (ref 80.0–100.0)
MPV: 11.2 fL (ref 9.4–12.3)
Monocytes Absolute Automated: 0.16 10*3/uL (ref 0.00–1.20)
Monocytes: 2 %
Neutrophils Absolute: 9.87 10*3/uL — ABNORMAL HIGH (ref 1.80–8.10)
Neutrophils: 91 %
Nucleated RBC: 0 /100 WBC (ref 0–1)
Platelets: 199 10*3/uL (ref 140–400)
RBC: 4.55 10*6/uL (ref 4.20–5.40)
RDW: 13 % (ref 12–15)
WBC: 10.87 10*3/uL — ABNORMAL HIGH (ref 3.50–10.80)

## 2013-10-03 LAB — HCG QUANTITATIVE: hCG, Quant.: 163485.5

## 2013-10-03 LAB — GFR: EGFR: >60

## 2013-10-03 LAB — HEMOLYSIS INDEX: Hemolysis Index: 10 (ref 0–18)

## 2013-10-03 LAB — LIPASE: Lipase: 44 U/L (ref 8–78)

## 2013-10-03 MED ORDER — ONDANSETRON 4 MG PO TBDP
4.0000 mg | ORAL_TABLET | Freq: Four times a day (QID) | ORAL | Status: AC | PRN
Start: 2013-10-03 — End: 2013-10-10

## 2013-10-03 MED ORDER — CEPHALEXIN 500 MG PO CAPS
500.0000 mg | ORAL_CAPSULE | Freq: Four times a day (QID) | ORAL | Status: AC
Start: 2013-10-03 — End: 2013-10-10

## 2013-10-03 MED ORDER — ONDANSETRON HCL 4 MG/2ML IJ SOLN
4.0000 mg | Freq: Once | INTRAMUSCULAR | Status: AC
Start: 2013-10-03 — End: 2013-10-03
  Administered 2013-10-03: 4 mg via INTRAVENOUS
  Filled 2013-10-03: qty 2

## 2013-10-03 MED ORDER — SODIUM CHLORIDE 0.9 % IV BOLUS
1000.0000 mL | Freq: Once | INTRAVENOUS | Status: AC
Start: 2013-10-03 — End: 2013-10-04
  Administered 2013-10-03: 1000 mL via INTRAVENOUS

## 2013-10-03 NOTE — Discharge Instructions (Signed)
Please take Zofran for nausea and vomiting.    Hyperemesis Gravidarum    You have been seen for vomiting in pregnancy.   Severe vomiting that lasts a long time is called "hyperemesis."    Nausea and vomiting are very common in the first 12 weeks of pregnancy. The cause is not well understood. If you have abdominal (belly) pain with the nausea and vomiting this is a sign of a more serious problem.    If vomiting and loss of appetite cause weight loss, this can change your body's electrolytes. This can affect the baby.     Vomiting during pregnancy is treated with medication for nausea. The medications Phenergan (Promethazine), Reglan (Metoclopramide), Compazine (Prochlorperazine) and Zofran (Ondansetron) are safe to use for a short time during pregnancy.    If the doctor prescribes medications, use them as directed to help stop the vomiting.    Eat and drink frequently. Take only small bites or sips at a time. This may help you to keep the food or liquid down.    See your obstetrician in the next few days.    YOU SHOULD SEEK MEDICAL ATTENTION IMMEDIATELY, EITHER HERE OR AT THE NEAREST EMERGENCY DEPARTMENT, IF ANY OF THE FOLLOWING OCCURS:   Your vomiting continues even with nausea medication.   You see blood in your vomit.   You have trouble eating or keeping liquids down because of nausea.   You develop abdominal (belly) pain.    Umbilical Hernia    You have been diagnosed with an umbilical hernia.    A hernia is an abnormal opening in the abdominal wall. Most hernias result when a weakness in the abdominal wall allows a piece of intestine or fat to poke through the weak area. This creates a bulge that you might be able to see or feel.    Hernias often develop in the groin. They may also occur around any previous incision. Your hernia is around the umbilicus (belly button). Hernias may develop very suddenly, or over a period of several months or years.    Occasionally, the portion of the intestine  that has slipped through the hernia will become twisted (strangulated) or trapped (incarcerated). This will usually cause severe and persistent pain that will not go away. If this occurs, lie down and push on the bulge gently with your hand. If the pain worsens or persists, return here or go to the nearest Emergency Department IMMEDIATELY.    As long as the bowel or tissue can be pushed back through the hernia, it is not dangerous.    Umbilical hernias may cause a dull ache or mild pain. Usually, acetaminophen (Tylenol) or Ibuprofen (Motrin, Advil) is all that is needed to control the pain.    You should limit heavy lifting, straining or pushing.    Your physician may direct you to wear a binder or truss, which is similar to a brace around your belly.    Most hernias require surgical repair. You have been referred to:   Your family doctor who can direct you to a Careers adviser.    YOU SHOULD SEEK MEDICAL ATTENTION IMMEDIATELY, EITHER HERE OR AT THE NEAREST EMERGENCY DEPARTMENT, IF ANY OF THE FOLLOWING OCCURS:   Worsening pain or a change in the pain.   The hernia pops out and won t go back in.   Nausea or vomiting.   The hernia becomes tender, swollen, or discolored.      Pregnancy    It has been determined  that you are pregnant.    You originally came here to be treated for another problem, but pregnancy will affect the way we evaluate and treat your medical condition.    You have been referred to an obstetrician [facility] doctor). Make sure you call to make your appointment today. Close follow-up with an OB doctor is very important for you and your baby.    Make sure you obtain follow-up care as soon as possible, certainly within the next few days. Inform your doctor or obstetrician of your visit here and your pregnancy.    If you smoke, stop. This is necessary to protect the health of your fetus (developing baby) and yourself. Smoking makes you more likely to have a miscarriage (lose your  pregnancy).    Abusing alcohol or drugs will hurt your fetus (developing baby). It may cause a miscarriage, abnormal development, premature birth, or severe health or mental problems after birth. Avoid these substances from now until the end of your pregnancy.    If you are taking pain medications, antipsychotics, antidepressants, or seizure medication, check with your physician as soon as possible to see if you should continue with these medications.    If you develop pain or cramping in the abdomen (belly) or pelvis, vaginal bleeding, nausea/vomiting, dizziness/lightheadedness or passing out, then you should return here or go to the nearest Emergency Department immediately.    Keep yourself well-hydrated at all times by drinking a lot of fluid, and eat a balanced, healthy, nutritious diet.    You should take prenatal vitamins every day. These may be prescribed for you on this visit, or you may need to get them through your follow-up care provider. You can also purchase an over-the-counter equivalent that may be a lot less expensive. Ask you pharmacist for details.    Urinary Tract Infection    You have been diagnosed with a lower urinary tract infection (UTI). This is also called cystitis.    Cystitis is an infection in your bladder. Your doctor diagnosed it by testing your urine. Cystitis usually causes burning with urination or frequent urination. It might make you feel like you have to urinate even when you don't.     Cystitis is usually treated with antibiotics and medicine to help with pain.    It is VERY IMPORTANT that you fill your prescription and take all of the antibiotics as directed. If a lower urinary tract infection goes untreated for too long, it can become a kidney infection.    FOR WOMEN: To reduce the risk of getting cystitis again:   Always urinate before and after sexual intercourse.   Always wipe from front to back after urinating or having a bowel movement. Do not wipe from back  to front.   Drink plenty of fluids. Try to drink cranberry or blueberry juice. These juices have a chemical that stops bacteria from "sticking" to the bladder.    YOU SHOULD SEEK MEDICAL ATTENTION IMMEDIATELY, EITHER HERE OR AT THE NEAREST EMERGENCY DEPARTMENT, IF ANY OF THE FOLLOWING OCCURS:   You have a fever or shaking chills.   You feel nauseated or vomit.   You have pain in your side or back.   You don't get better after taking all of your antibiotics.   You have any new symptoms or concerns.   You feel worse or do not improve.            [facility] Referrals    Our Emergency Team of  doctors, nurses and staff want to thank you for choosing [facility] for your emergency medical care needs. We strive to provide you and your family with excellent care, each and every time the need may arise. We hope we have achieved that result.     As you leave our department, we want to provide you with some additional information, to answer any questions that you may have.  Please, read these aftercare instructions carefully. If you continue to have questions, or experience any  unexpected problems, please call us. We are here to serve you.    We understand that you currently may not have access to a primary care or family physician.    The following physicians are currently accepting new patients    Internal Medicine/Family Practice Referral List  For Use by [facility]  Alphabetical Listing    Johnnette Gourd, MD  [address]  [address]  [place], [place] [address]  Phone: [phone]  Fax: [phone]    [facility]  [address]  [place], [place] [address]  Phone: [phone]  Fax: [phone]  Elsie Amis, MD  Maryclare Bean. Benay Pike, MD  Bonnielee Haff. Mylo Red, MD  Erenest Rasher Pollyann Glen, MD    [facility]  [address]  [address]  [place], [place] [address]  Phone: [phone]  Fax: [phone]  Habib A. Crawford Givens,  MD  Frutoso Schatz, MD    [facility]  [address] [place], [place] [address]  Phone: [phone]  Fax: [phone]  Hoy Finlay, MD  Charlann Noss. Silis, MD    [facility]  [address]  [address]  [place], [place] [address]  Phone: [phone]  Fax: [phone]  Jeral Pinch, MD  Ayesha Mohair, MD (August 2011)    [facility]  [address]  [address]  [place], [place] [address]  Phone: [phone]  Fax: [phone]  Eduard Roux, MD   Masser Love, MD  Graylon Gunning, MD (May 2011)    [facility]  [address]  [place], [place] [address]  Phone: [phone]  Fax: [phone]  Rodrigo Ran, MD    Antoine Primas, MD  [address]  [place], [place] [address]  Phone: [phone]  Fax: [phone]    Orlan Leavens, MD  [address]  [place], [place] [address]  Phone: [phone]  Fax: [phone]    Loetta Rough, MD  [address]  [address]  [place], [place] [address]  Phone: [phone]  Fax: [phone]    Agnes Lawrence, MD  [facility]  [address]  [place], [place] [address]  Phone: [phone]  Fax: [phone]    Farrell Ours, MD  [address]  [place], [place] [address]  Phone: [phone]  Fax: [phone]    [facility]  [address]  [address]  [place], [place] [address]  Phone: [phone]  Fax: [phone]  Julieanne Cotton, MD    Bing Neighbors, MD  [address]  [address]  [place], [place] [address]  Phone: [phone]  Fax: [phone]    Vena Rua. Marney Doctor, MD  [address]  Suite [place], [place] [address]  Phone: [phone]  Fax: [phone]    Burr Medico, MD  [address]  Suite [place], [place] [address]  Phone: [phone]  Fax: [phone]    Peyton Najjar, MD  [address]  [place], [place] [address]  Phone: [phone]  Fax: [phone]    Sharlette Dense,  MD  [address]  [place], [place] [address]  Phone: [phone]  Fax: [phone]    Kayleen Memos. Clyda Greener, DO  [address]  [address]  [place], [place] [address]    Luvenia Redden, MD  [address]  [place], [place] [address]  Phone: [phone]  Fax: [phone]    [facility]  [address]  [address]  [place], [place] [address]  [phone]  [phone]  Gretta Began. Higinio Plan, MD    [facility]  [address]  [place], [place] [address]  Phone: [phone]  Fax: [phone]  Email: Earnie Larsson .[facility]  [address] [place], [place] [address]  Phone: [phone]  Fax: [phone]  Email:[email or web address]  Website: [email or web address]    [facility]  [address]  [place], [place] [address]  Phone: [phone]   Email: [email or web address]    Chauncey Mann [facility]   [address]   [place], [place] [address]  Phone: [phone]  Fax: [phone]  Website: [email or web address]     [facility]  [address]  [place], [place] [address]  Phone: [phone]  Fax: [phone]  Email: [email or web address]   Website: [email or web address]    [facility]  [address]  [place], [place] [address]  Phone: [phone]  Email: [email or web address] .[facility]  [address]  [place], [place] [address]  Phone: [phone]   Email: [email or web address] .[facility]  [address]  [place], [place] [address]  Phone: [phone]  Email: [email or web address] .[facility]  Phone: [phone]

## 2013-10-03 NOTE — ED Notes (Signed)
Patient is 3 months pregnant with twins and has had hyperemesis since this morning. States she can't keep anything down and she felt dehydrated.

## 2013-10-03 NOTE — ED Provider Notes (Addendum)
EMERGENCY DEPARTMENT HISTORY AND PHYSICAL EXAM     Physician/Midlevel provider first contact with patient: 10/03/13 2127         Date: 10/03/2013  Patient Name: Renee Mosley    History of Presenting Illness     Chief Complaint   Patient presents with   . Emesis During Pregnancy       History Provided By: pt    Chief Complaint: vomiting  Onset: today  Timing: gradual  Severity: moderate  Modifying Factors: none tried  Associated Symptoms: epigastric and periumbilical abd pain, increased frequency of BMs, nausea    Additional History: Renee Mosley is a 33 y.o. female G4:P3 c/o gradual vomiting that started today. Reports epigastric and periumbilical abd pain due to the vomiting. States that she did not have these sx before today. Reports that she has an appointment scheduled 10/12/13 with OBGYN. + current nausea, taking prenatal vitamins. Last BM was today, x 2-3 episodes. Her LNMP was 07/05/13. States this is a twin gestation.    Denies CP, SOB, back pain, dizziness, weakness, fever, dysuria, vaginal bleeding, allergies, drinking EtOH or smoking. PMHx: umbilical hernia    OBGYN: [phone].    PCP: Patsy Lager, MD      Current Facility-Administered Medications   Medication Dose Route Frequency Provider Last Rate Last Dose   . [COMPLETED] ondansetron (ZOFRAN) injection 4 mg  4 mg Intravenous Once Lorenza Cambridge, MD   4 mg at 10/03/13 2223   . [COMPLETED] ondansetron (ZOFRAN) injection 4 mg  4 mg Intravenous Once Lorenza Cambridge, MD   4 mg at 10/03/13 2324   . [COMPLETED] sodium chloride 0.9 % bolus 1,000 mL  1,000 mL Intravenous Once Lorenza Cambridge, MD   1,000 mL at 10/03/13 2223     Current Outpatient Prescriptions   Medication Sig Dispense Refill   . cephALEXin (KEFLEX) 500 MG capsule Take 1 capsule (500 mg total) by mouth 4 (four) times daily.  28 capsule  0   . ondansetron (ZOFRAN ODT) 4 MG disintegrating tablet Take 1 tablet (4 mg total) by mouth every 6 (six) hours as needed for Nausea.  15 tablet   0       Past History     Past Medical History:  Past Medical History   Diagnosis Date   . Hernia        Past Surgical History:  History reviewed. No pertinent past surgical history.    Family History:  No family history on file.    Social History:  History   Substance Use Topics   . Smoking status: Not on file   . Smokeless tobacco: Not on file   . Alcohol Use:    Denies tobacco and etoh use    Allergies:  No Known Allergies    Review of Systems     Review of Systems   Constitutional: Negative for fever.   Eyes: Negative for discharge and redness.   Respiratory: Negative for shortness of breath.    Cardiovascular: Negative for chest pain.   Gastrointestinal: Positive for nausea, vomiting and abdominal pain (epigastric, periumbilical).        + increased frequency in BM   Genitourinary: Negative for dysuria.        - vaginal bleeding   Musculoskeletal: Negative for back pain.   Neurological: Negative for dizziness and weakness.   Endo/Heme/Allergies:        NKDA   Psychiatric/Behavioral: Negative for substance abuse.  Physical Exam   BP 118/60  Pulse 63  Temp 97.8 F (36.6 C)  Resp 18  SpO2 99%  LMP 07/05/2013    Physical Exam   Nursing note and vitals reviewed.  Constitutional: She is well-developed, well-nourished, and in no distress.   HENT:   Head: Normocephalic and atraumatic.   Mouth/Throat: Oropharynx is clear and moist.   Eyes: Conjunctivae normal are normal. Pupils are equal, round, and reactive to light.   Cardiovascular: Normal rate, regular rhythm and normal heart sounds.    No murmur heard.  Pulmonary/Chest: Effort normal and breath sounds normal. No respiratory distress.   Abdominal: Soft. She exhibits no distension. There is tenderness (Mild periumbical ttp at site of umbilical hernia - reducible).   Musculoskeletal: Normal range of motion. She exhibits no edema.   Lymphadenopathy:     She has no cervical adenopathy.   Neurological: She is alert. GCS score is 15.   Skin: Skin is warm and  dry.   Psychiatric:        Flat affect       Diagnostic Study Results     Labs -     Results     Procedure Component Value Units Date/Time    UA, Reflex to Microscopic [[id number]]  (Abnormal) Collected:10/03/13 2325    Specimen Information:Urine Updated:10/03/13 2351     Urine Type Clean Catch      Color, UA Yellow      Clarity, UA Sl Cloudy (A)      Specific Gravity UA 1.034      Urine pH 6.0      Leukocyte Esterase, UA Trace (A)      Nitrite, UA Negative      Protein, UR 100 (A)      Glucose, UA 50 (A)      Ketones UA 80      Urobilinogen, UA Negative mg/dL      Bilirubin, UA Negative      Blood, UA Negative      RBC, UA 11 - 25 (A) /hpf      WBC, UA 6 - 10 (A) /hpf      Squamous Epithelial Cells, Urine 6 - 10 /hpf      Urine Bacteria Few (A) /hpf      Urine Mucus Present     Beta HCG, Quant, Serum [[id number]] Collected:10/03/13 2216     hCG, Quant. 062694.8 Updated:10/03/13 2304    Comprehensive metabolic panel [[id number]]  (Abnormal) Collected:10/03/13 2216    Specimen Information:Blood Updated:10/03/13 2242     Glucose 119 (H) mg/dL      BUN 09.3 mg/dL      Creatinine 0.7 mg/dL      Sodium 818 mEq/L      Potassium 4.0 mEq/L      Chloride 104 mEq/L      CO2 23 mEq/L      CALCIUM 8.9 mg/dL      Protein, Total 6.3 g/dL      Albumin 3.5 g/dL      AST (SGOT) 17 U/L      ALT 18 U/L      Alkaline Phosphatase 46 U/L      Bilirubin, Total 0.5 mg/dL      Globulin 2.8 g/dL      Albumin/Globulin Ratio 1.3      Anion Gap 11.0     Lipase [[id number]] Collected:10/03/13 2216    Specimen Information:Blood Updated:10/03/13 2242     Lipase 44 U/L  Hemolysis index [[id number]] Collected:10/03/13 2216     Hemolysis Index 10 Updated:10/03/13 2242    GFR [[id number]] Collected:10/03/13 2216     EGFR >60.0 Updated:10/03/13 2242    CBC with differential [[id number]]  (Abnormal) Collected:10/03/13 2216    Specimen Information:Blood / Blood Updated:10/03/13 2233     WBC 10.87 (H) x10 3/uL      RBC 4.55 x10 6/uL      Hgb 13.5 g/dL       Hematocrit 13.2 %      MCV 83.1 fL      MCH 29.7 pg      MCHC 35.7 g/dL      RDW 13 %      Platelets 199 x10 3/uL      MPV 11.2 fL      Neutrophils 91 %      Lymphocytes Automated 8 %      Monocytes 2 %      Eosinophils Automated 0 %      Basophils Automated 0 %      Immature Granulocyte 0 %      Nucleated RBC 0 /100 WBC      Neutrophils Absolute 9.87 (H) x10 3/uL      Abs Lymph Automated 0.82 x10 3/uL      Abs Mono Automated 0.16 x10 3/uL      Abs Eos Automated 0.00 x10 3/uL      Absolute Baso Automated 0.02 x10 3/uL      Absolute Immature Granulocyte 0.02 x10 3/uL           Radiologic Studies -   Radiology Results (24 Hour)     ** No Results found for the last 24 hours. **      .      Medical Decision Making   I am the first provider for this patient.    I reviewed the vital signs, available nursing notes, past medical history, past surgical history, family history and social history.    Vital Signs-Reviewed the patient's vital signs.     Patient Vitals for the past 12 hrs:   BP Temp Pulse Resp   10/03/13 2316 118/60 mmHg 97.8 F (36.6 C) 63  18    10/03/13 2115 140/65 mmHg 99 F (37.2 C) 88  18        Pulse Oximetry Analysis - Normal 97% on RA    Old Medical Records: Nursing notes.     ED Course:   10:42 PM  physician re-evaluated pt at bedside and pt is feeling better after being given Zofran. Refuses any further medications at this time.    11:13 PM  physician re-evaluated pt at bedside and pt is feeling better. Reports that she is still nauseous and has just given a urine sample.    11:57 PM  Sleeping in stretcher. Discussed test results with pt and counseled on diagnosis, f/u plans, and signs and symptoms when to return to ED.  Pt is stable and ready for discharge. Tolerated po before d/c    Provider Notes:     Diagnosis     Clinical Impression:   1. Nausea/vomiting in pregnancy    2. Umbilical hernia    3. Pregnancy    4. UTI (urinary tract infection)         _______________________________    Attestations:  This note is prepared by Adah Perl, acting as Scribe for Chrissie Noa, MD.    Chrissie Noa, MD The scribe's documentation has been prepared under  my direction and personally reviewed by me in its entirety.  I confirm that the note above accurately reflects all work, treatment, procedures, and medical decision making performed by me.    _______________________________          Lorenza Cambridge, MD  10/04/13 3875    Lorenza Cambridge, MD  10/04/13 [phone]

## 2014-05-15 ENCOUNTER — Encounter: Payer: Self-pay | Admitting: Obstetrics

## 2014-06-30 ENCOUNTER — Encounter (HOSPITAL_COMMUNITY): Payer: Self-pay | Admitting: *Deleted

## 2014-07-27 ENCOUNTER — Encounter (HOSPITAL_COMMUNITY): Payer: Self-pay | Admitting: Surgery

## 2014-12-21 ENCOUNTER — Encounter (HOSPITAL_COMMUNITY): Payer: Self-pay | Admitting: Surgery

## 2015-06-28 IMAGING — CT CT ABD-PELV W/ CM
1 of 2 series · 15 of 32 positions shown, 19 images · IV contrast (OMNIPAQUE)
Comparison: None.

CLINICAL DATA: Acute onset of paraumbilical pain. Nausea and
vomiting. Suspect umbilical hernia.

EXAM:
CT ABDOMEN AND PELVIS WITH CONTRAST
TECHNIQUE: Multidetector CT imaging of the abdomen and pelvis was performed
using the standard protocol following bolus administration of
intravenous contrast.
CONTRAST:  100mL OMNIPAQUE IOHEXOL 300 MG/ML  SOLN

[Series 2: routine abdomen/pelvis with · axial · 0.76mm/px · z∈[-568,-133]mm · 15 of 95 slices shown, 19 images]
[im 4/95  soft-tissue]
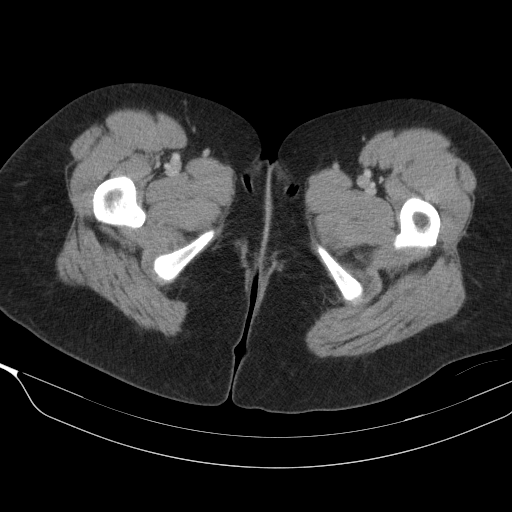
[im 4/95  bone]
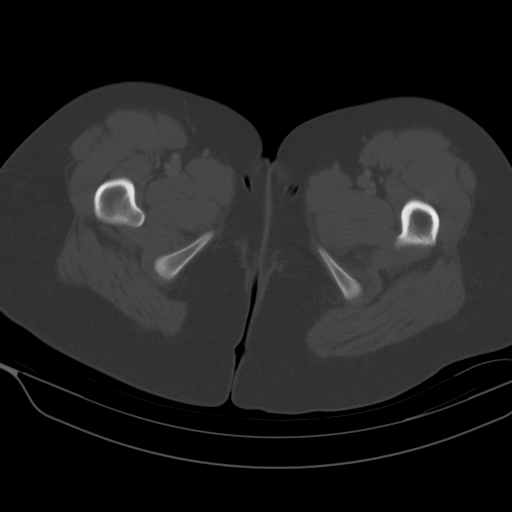
[im 12/95  soft-tissue]
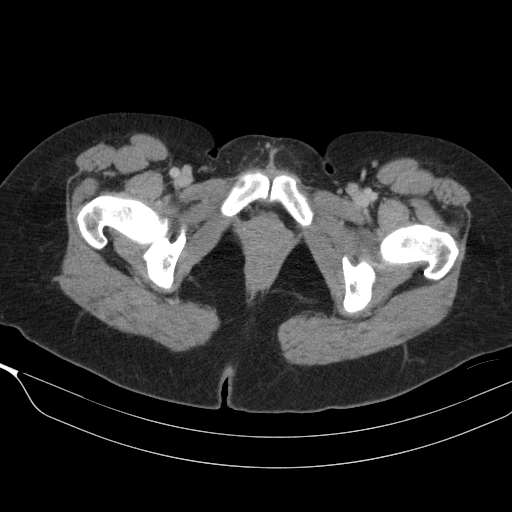
[im 19/95  soft-tissue]
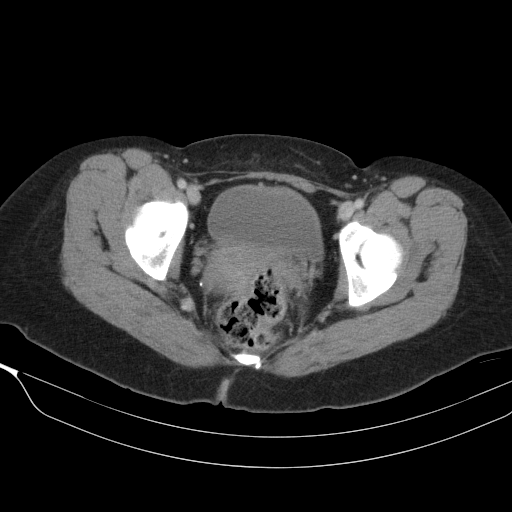
[im 27/95  soft-tissue]
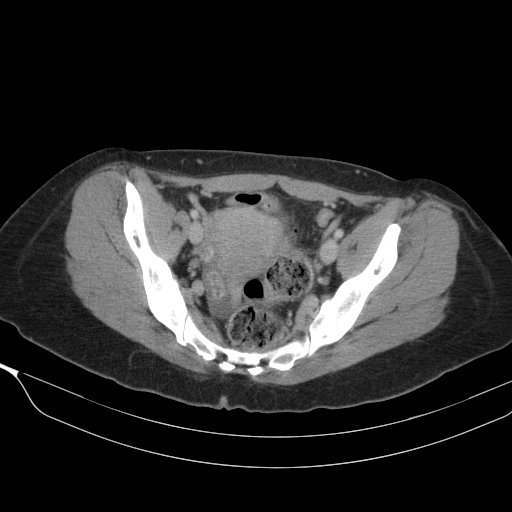
[im 34/95  soft-tissue]
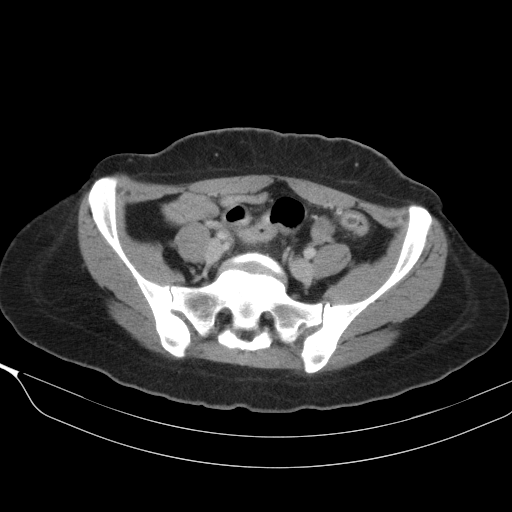
[im 42/95  soft-tissue]
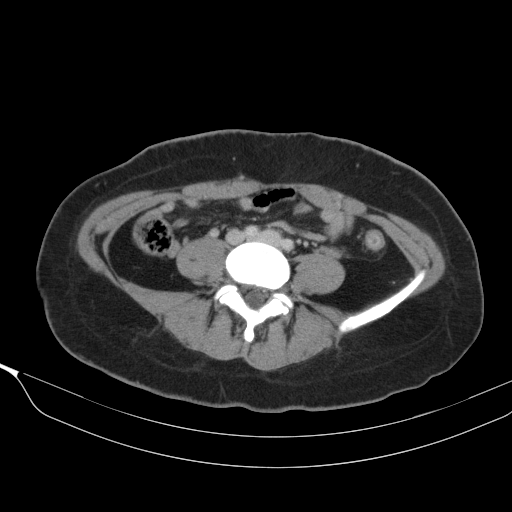
[im 49/95  soft-tissue]
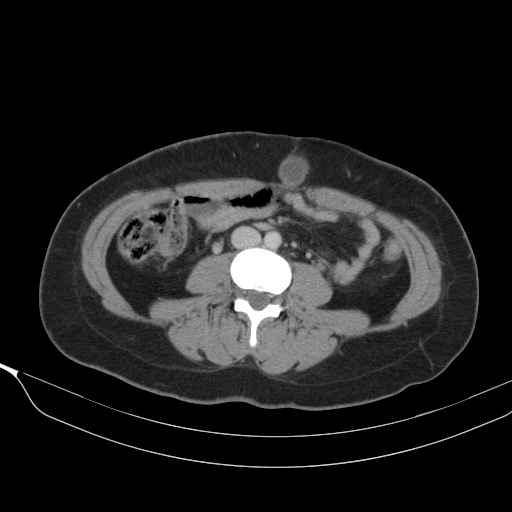
[im 53/95  soft-tissue]
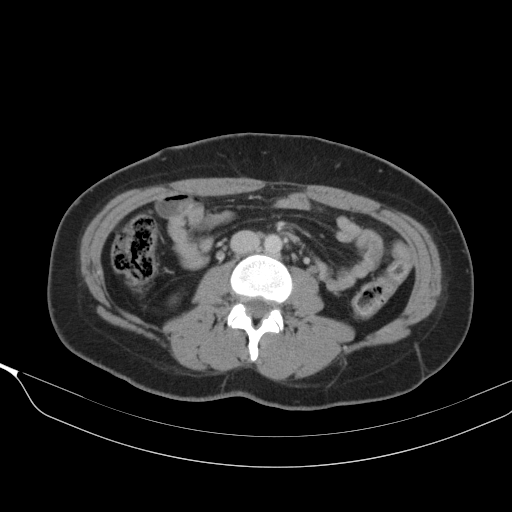
[im 61/95  soft-tissue]
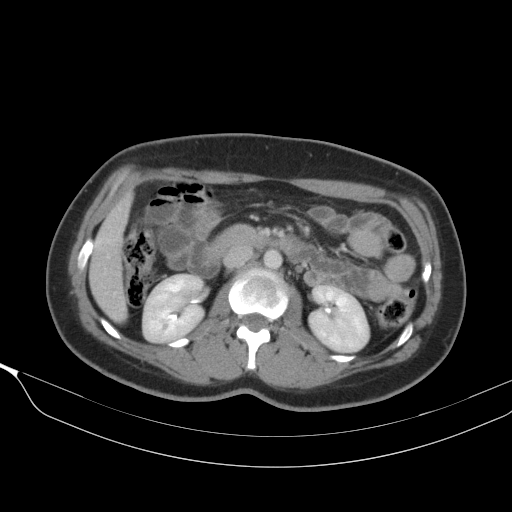
[im 61/95  bone]
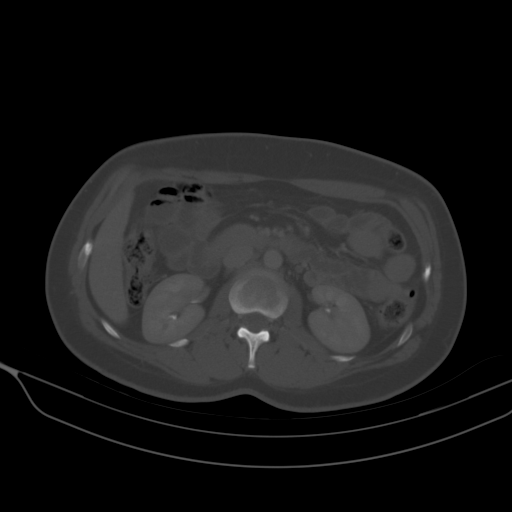
[im 68/95  soft-tissue]
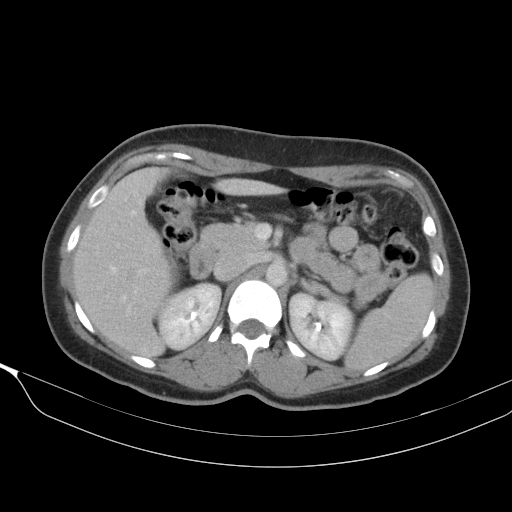
[im 76/95  soft-tissue]
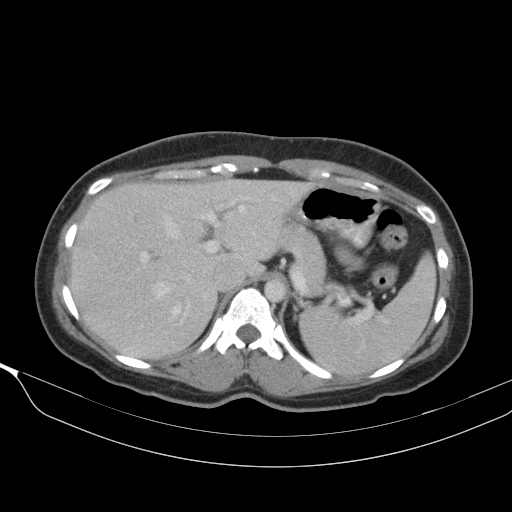
[im 79/95  lung]
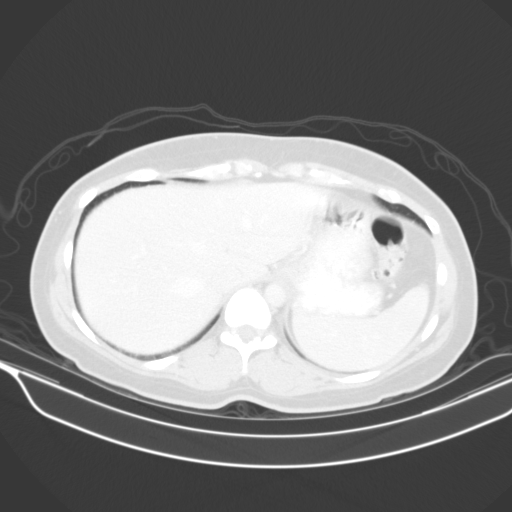
[im 83/95  soft-tissue]
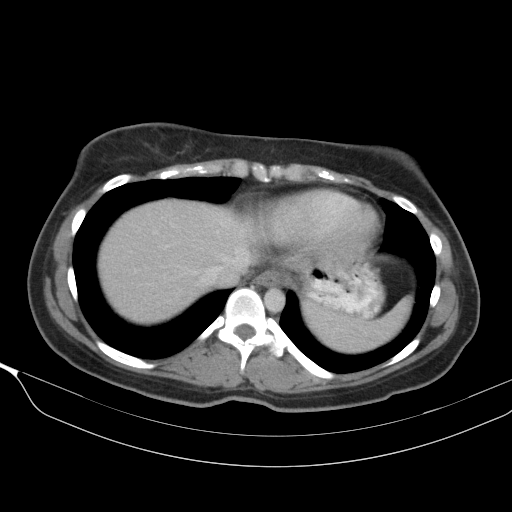
[im 83/95  lung]
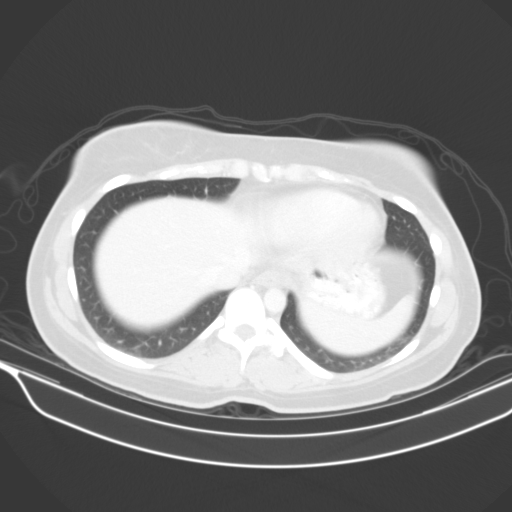
[im 87/95  lung]
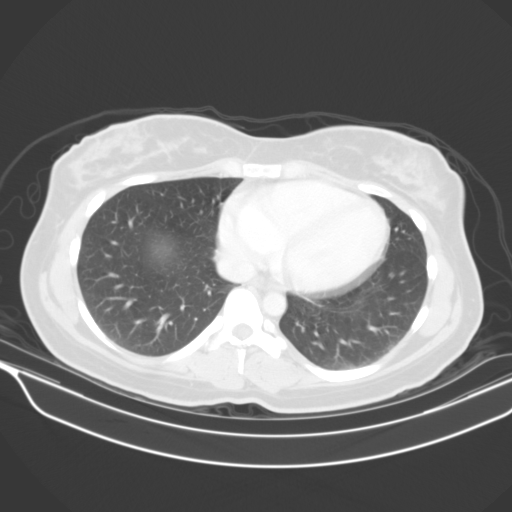
[im 91/95  soft-tissue]
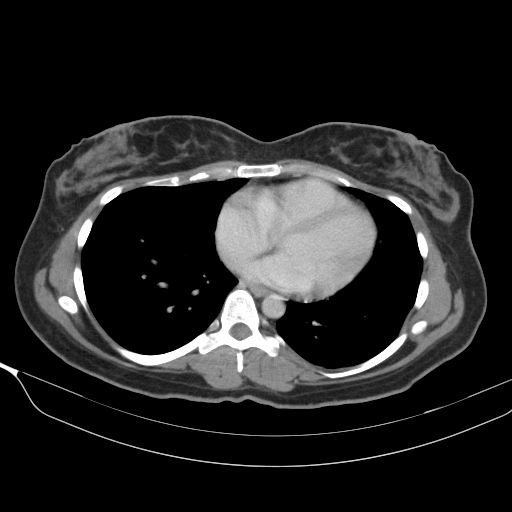
[im 91/95  lung]
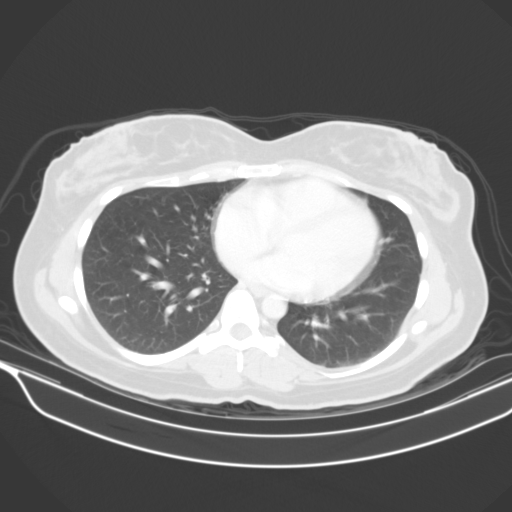

[15 of 32 positions shown; findings below may reference images not displayed]

FINDINGS: A paraumbilical hernia is seen containing a loop of small bowel.
There is mild dilatation proximal small bowel loops containing
air-fluid levels. Distal small bowel loops are nondilated. This
consistent with a low-grade small bowel obstruction. No evidence of
bowel wall thickening or pneumatosis.

A small amount of free fluid is seen in the pelvis. Uterus and
adnexal regions are otherwise unremarkable in appearance.

Surgical clips seen from prior cholecystectomy. The liver, pancreas,
spleen, adrenal glands, and kidneys are normal in appearance. No
evidence of hydronephrosis. No soft tissue masses or lymphadenopathy
identified.
IMPRESSION: Small paraumbilical hernia containing a loop of small bowel, and
causing a low-grade small bowel obstruction.

Smaller free fluid in pelvis. No evidence of inflammatory process or
abscess.

## 2015-08-23 IMAGING — US US OB COMP LESS 14 WK
1 series · 14 of 28 positions shown · non-contrast
Comparison: None.

CLINICAL DATA: Abdominal pain. Seven weeks pregnant based on last
menstrual cycle.

EXAM:
OBSTETRIC <14 WK ULTRASOUND
TECHNIQUE: Transabdominal ultrasound was performed for evaluation of the
gestation as well as the maternal uterus and adnexal regions.

[Series 1: us ob comp less 14 wks · 47 acquisitions, 14 frames shown]
[im 2/47]
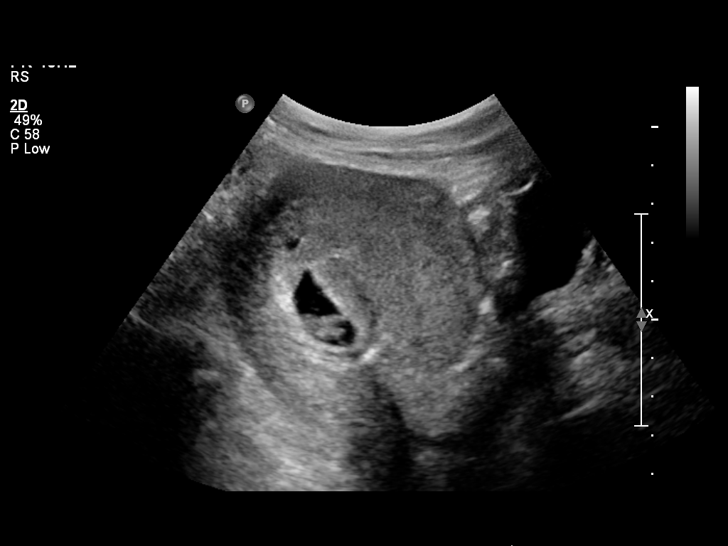
[im 6/47]
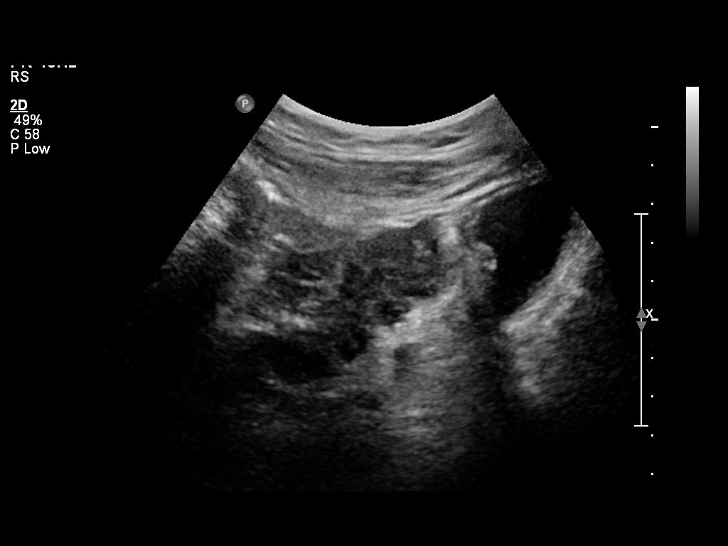
[im 9/47]
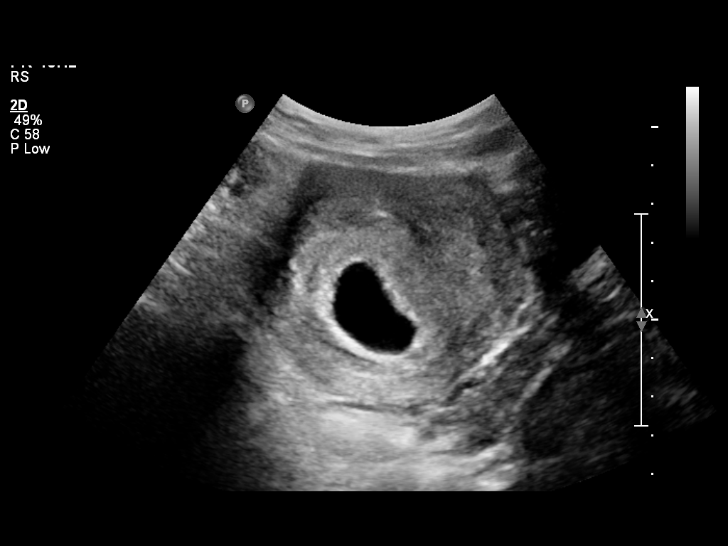
[im 12/47]
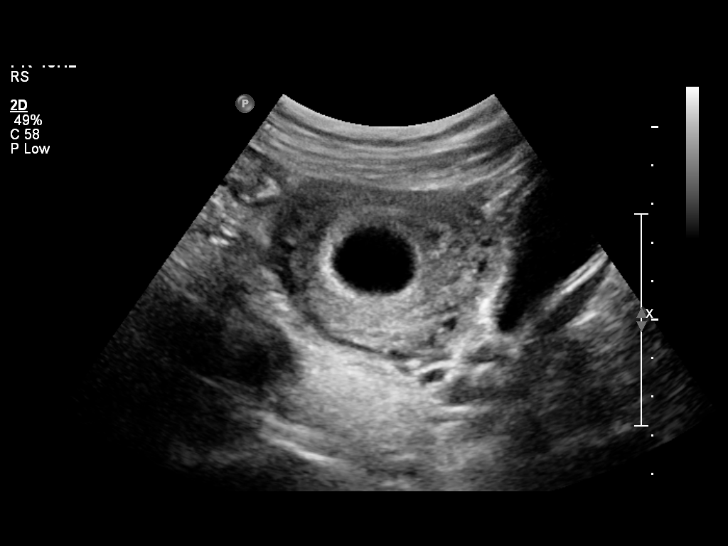
[im 16/47]
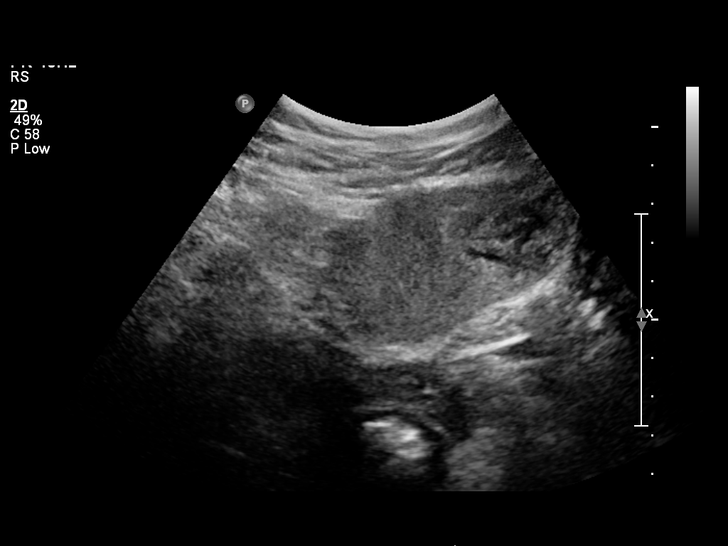
[im 19/47]
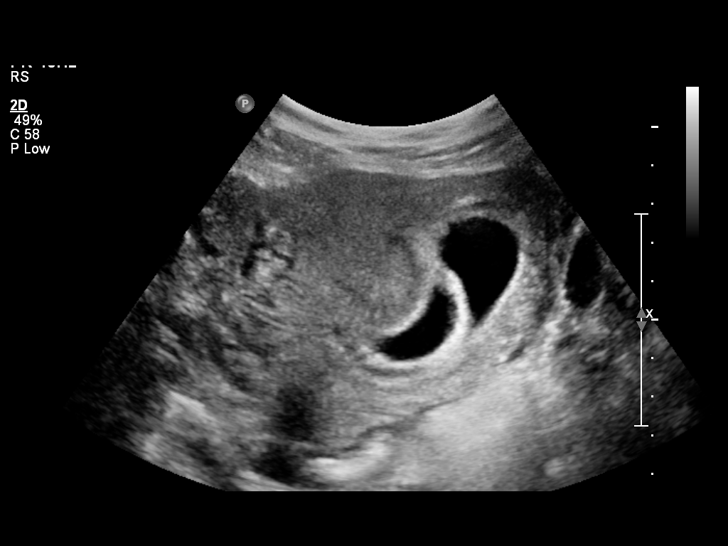
[im 23/47]
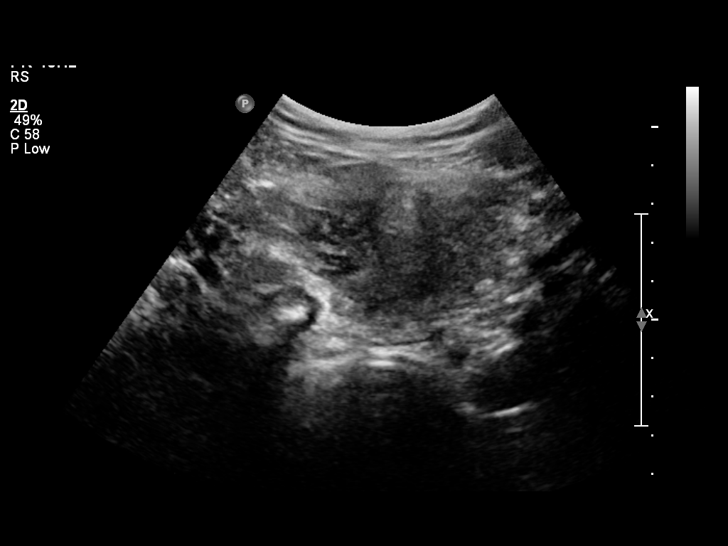
[im 26/47]
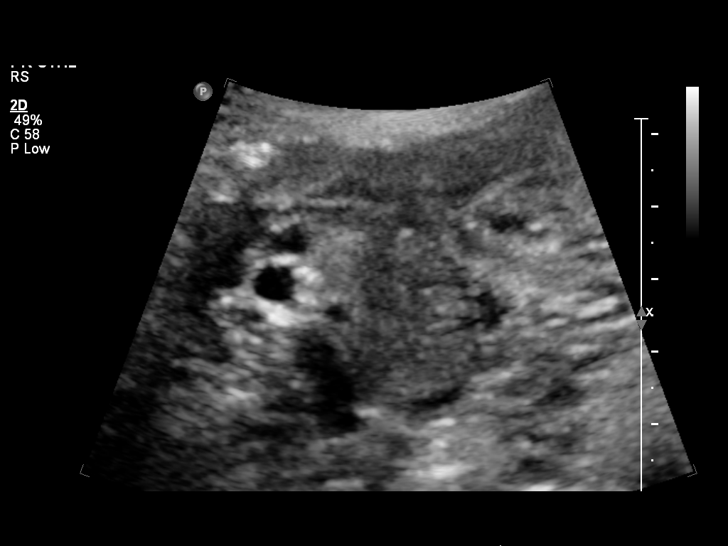
[im 29/47]
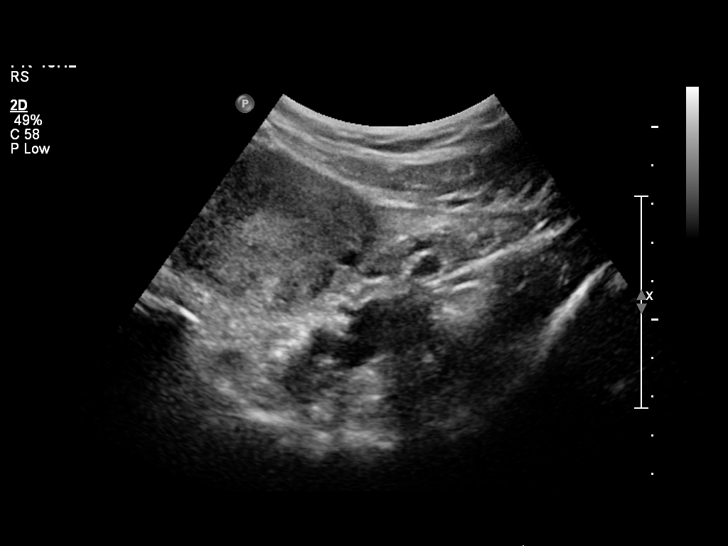
[im 33/47]
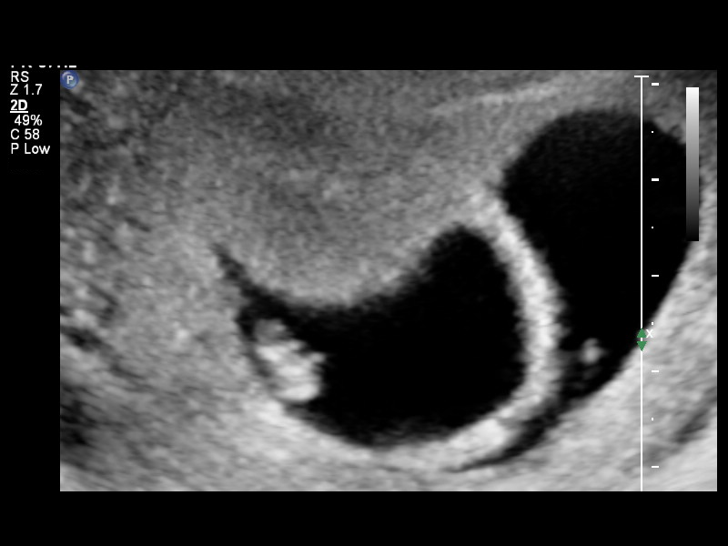
[im 36/47]
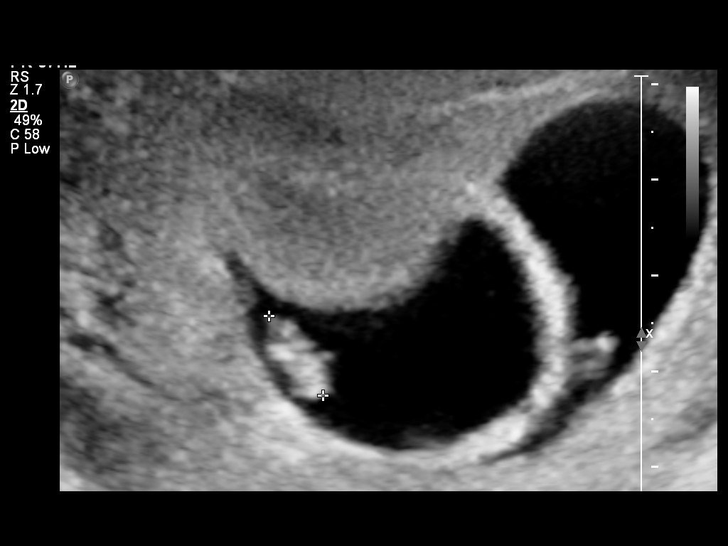
[im 40/47]
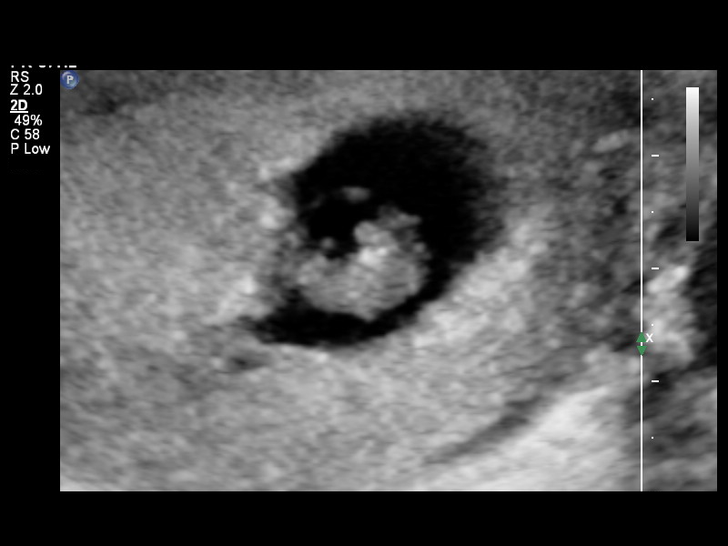
[im 43/47]
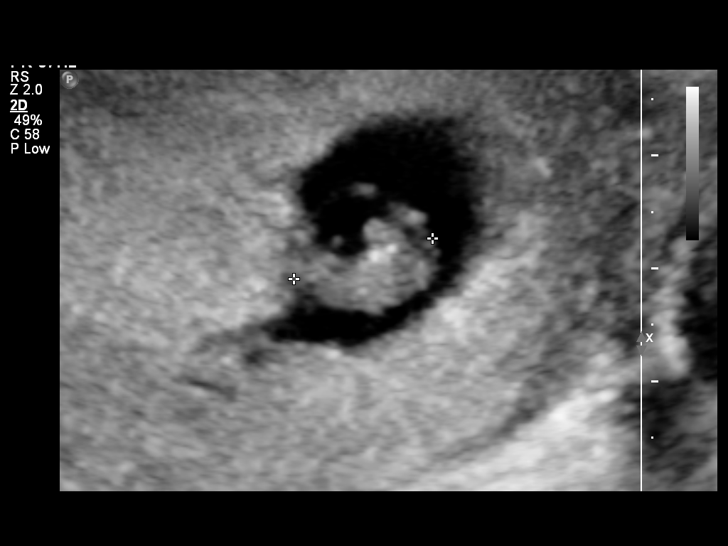
[im 47/47]
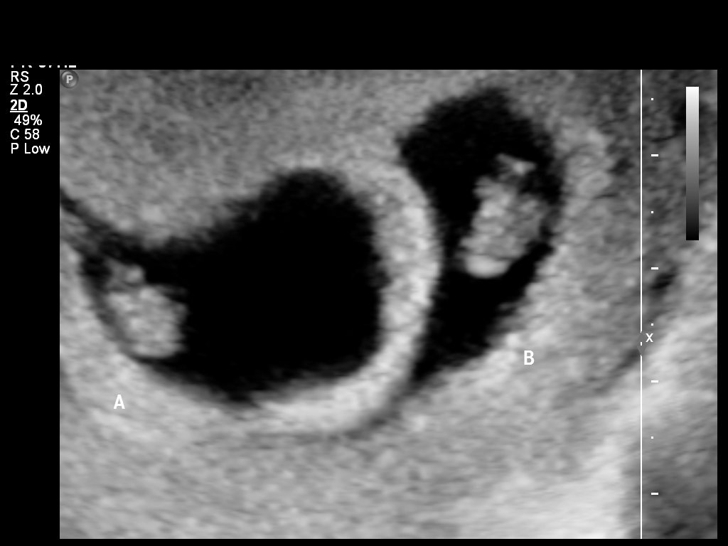

[14 of 28 positions shown; findings below may reference images not displayed]

FINDINGS: Intrauterine gestational sac: 2 contiguous gestational sacs, which
superior diamniotic/dichorionic.

Yolk sac: 2 discrete yolk sacs visualized 1 in each gestational sac.

Embryo:  2 discrete embryo as, 1 in each gestational sac.

Cardiac Activity: Yes for each embryo

Heart Rate: Twin A,  143 beats per min.  Twin B, 155 bpm

CRL-A: 10.2  mm   7 w   1  d

CRL-B: 12.3 mm 7 w 4 d US EDC: A- 04/12/2014. B- 04/09/2014

Maternal uterus/adnexae: No subchronic hemorrhage. No uterine mass.
Normal right ovary. Left ovary not visualized. No adnexal mass. No
pelvic free fluid.
IMPRESSION: 1. Twin, live intrauterine pregnancies, diamniotic and dichorionic.
2. No emergent pregnancy or maternal complication.
